# Patient Record
Sex: Female | Born: 1955 | Race: Black or African American | Hispanic: No | Smoking: Current every day smoker
Health system: Southern US, Community
[De-identification: ages and names within clinical notes are randomized; demographics above are authoritative.]

## PROBLEM LIST (undated history)

## (undated) DIAGNOSIS — F419 Anxiety disorder, unspecified: Secondary | ICD-10-CM

## (undated) DIAGNOSIS — K219 Gastro-esophageal reflux disease without esophagitis: Secondary | ICD-10-CM

## (undated) DIAGNOSIS — E119 Type 2 diabetes mellitus without complications: Secondary | ICD-10-CM

## (undated) DIAGNOSIS — M1990 Unspecified osteoarthritis, unspecified site: Secondary | ICD-10-CM

## (undated) DIAGNOSIS — E785 Hyperlipidemia, unspecified: Secondary | ICD-10-CM

## (undated) DIAGNOSIS — I10 Essential (primary) hypertension: Secondary | ICD-10-CM

## (undated) DIAGNOSIS — G709 Myoneural disorder, unspecified: Secondary | ICD-10-CM

## (undated) DIAGNOSIS — J302 Other seasonal allergic rhinitis: Secondary | ICD-10-CM

## (undated) HISTORY — DX: Essential (primary) hypertension: I10

## (undated) HISTORY — DX: Type 2 diabetes mellitus without complications (CMS-HCC): E11.9

## (undated) HISTORY — PX: CHOLECYSTECTOMY: SHX55

## (undated) MED ORDER — NA SULFATE-K SULFATE-MG SULF 17.5-3.13-1.6 GM/177ML PO SOLN
1.00 | Freq: Once | ORAL | 0 refills | Status: AC
Start: 2020-09-11 — End: 2020-09-11

---

## 2005-10-12 ENCOUNTER — Emergency Department: Payer: Self-pay | Admitting: Unknown Physician Specialty

## 2006-01-16 ENCOUNTER — Emergency Department: Payer: Self-pay | Admitting: Unknown Physician Specialty

## 2009-12-13 ENCOUNTER — Ambulatory Visit: Payer: Self-pay | Admitting: Internal Medicine

## 2011-01-16 ENCOUNTER — Emergency Department: Payer: Self-pay | Admitting: Emergency Medicine

## 2013-01-05 ENCOUNTER — Emergency Department: Payer: Self-pay | Admitting: Emergency Medicine

## 2013-01-05 LAB — CBC WITH DIFFERENTIAL/PLATELET
Basophil #: 0.1 10*3/uL (ref 0.0–0.1)
Basophil %: 1.1 %
Eosinophil #: 0.2 10*3/uL (ref 0.0–0.7)
Eosinophil %: 2.5 %
HCT: 42.3 % (ref 35.0–47.0)
HGB: 14.9 g/dL (ref 12.0–16.0)
Lymphocyte #: 3.4 10*3/uL (ref 1.0–3.6)
Lymphocyte %: 37.8 %
MCHC: 35.2 g/dL (ref 32.0–36.0)
Monocyte #: 0.7 x10 3/mm (ref 0.2–0.9)
Monocyte %: 7.4 %
Neutrophil #: 4.6 10*3/uL (ref 1.4–6.5)
Neutrophil %: 51.2 %
RBC: 4.18 10*6/uL (ref 3.80–5.20)
RDW: 12.5 % (ref 11.5–14.5)
WBC: 9 10*3/uL (ref 3.6–11.0)

## 2013-01-05 LAB — TROPONIN I: Troponin-I: <0.02 ng/mL

## 2013-01-05 LAB — COMPREHENSIVE METABOLIC PANEL
Albumin: 4 g/dL (ref 3.4–5.0)
Anion Gap: 5 — ABNORMAL LOW (ref 7–16)
BUN: 21 mg/dL — ABNORMAL HIGH (ref 7–18)
Co2: 30 mmol/L (ref 21–32)
Creatinine: 0.74 mg/dL (ref 0.60–1.30)
EGFR (Non-African Amer.): >60
Glucose: 134 mg/dL — ABNORMAL HIGH (ref 65–99)
SGOT(AST): 16 U/L (ref 15–37)
Sodium: 136 mmol/L (ref 136–145)

## 2013-01-05 LAB — CK TOTAL AND CKMB (NOT AT ARMC)
CK, Total: 65 U/L (ref 21–215)
CK-MB: 1 ng/mL (ref 0.5–3.6)

## 2013-10-30 ENCOUNTER — Ambulatory Visit: Payer: Self-pay | Admitting: Family Medicine

## 2015-01-25 ENCOUNTER — Emergency Department: Payer: Self-pay

## 2015-01-25 ENCOUNTER — Encounter: Payer: Self-pay | Admitting: Emergency Medicine

## 2015-01-25 DIAGNOSIS — E119 Type 2 diabetes mellitus without complications: Secondary | ICD-10-CM | POA: Insufficient documentation

## 2015-01-25 DIAGNOSIS — M25561 Pain in right knee: Secondary | ICD-10-CM | POA: Insufficient documentation

## 2015-01-25 DIAGNOSIS — Z720 Tobacco use: Secondary | ICD-10-CM | POA: Insufficient documentation

## 2015-01-25 DIAGNOSIS — M1990 Unspecified osteoarthritis, unspecified site: Secondary | ICD-10-CM | POA: Insufficient documentation

## 2015-01-25 DIAGNOSIS — M7981 Nontraumatic hematoma of soft tissue: Secondary | ICD-10-CM | POA: Insufficient documentation

## 2015-01-25 DIAGNOSIS — I10 Essential (primary) hypertension: Secondary | ICD-10-CM | POA: Insufficient documentation

## 2015-01-25 NOTE — ED Notes (Signed)
Patient states that 2 days ago she ran into her coffee table and she had a "knot" to her left leg. Patient states that today she noticed swelling and pain to right knee.

## 2015-01-26 ENCOUNTER — Emergency Department
Admission: EM | Admit: 2015-01-26 | Discharge: 2015-01-26 | Disposition: A | Discharge status: Home / Self Care | Payer: Self-pay | Attending: Student | Admitting: Student

## 2015-01-26 DIAGNOSIS — M25561 Pain in right knee: Secondary | ICD-10-CM

## 2015-01-26 DIAGNOSIS — M1990 Unspecified osteoarthritis, unspecified site: Secondary | ICD-10-CM

## 2015-01-26 MED ORDER — ACETAMINOPHEN 500 MG PO TABS
1000.0000 mg | ORAL_TABLET | Freq: Once | ORAL | Status: AC
  Administered 2015-01-26: 1000 mg via ORAL
  Filled 2015-01-26: qty 2

## 2015-01-26 NOTE — ED Provider Notes (Signed)
[facility] Emergency Department Provider Note  ____________________________________________  Time seen: Approximately 2:24 AM  I have reviewed the triage vital signs and the nursing notes.   HISTORY  Chief Complaint Knee Pain    HPI Christine Buchanan is a 59 y.o. female with history of hypertension and diabetes presents for evaluation of right knee pain, gradual onset, constant since onset. Patient reports that 3 or 4 days ago she bumped her right shin on her coffee table after which she developed a small bruise. Shortly afterward, she developed some pain and perceived swelling in the right knee which is worse with movement. Current severity of symptoms is mild. She denies any calf swelling or tenderness. No chest pain or difficulty breathing. She is otherwise been in her usual state of health.   No past medical history on file.  There are no active problems to display for this patient.   No past surgical history on file.  No current outpatient prescriptions on file.  Allergies Sulfa antibiotics  No family history on file.  Social History Social History  Substance Use Topics  . Smoking status: Current Every Day Smoker -- 0.50 packs/day for 40 years    Types: Cigarettes  . Smokeless tobacco: None  . Alcohol Use: Yes     Comment: occasionly    Review of Systems Constitutional: No fever/chills Eyes: No visual changes. ENT: No sore throat. Cardiovascular: Denies chest pain. Respiratory: Denies shortness of breath. Gastrointestinal: No abdominal pain.  No nausea, no vomiting.  No diarrhea.  No constipation. Genitourinary: Negative for dysuria. Musculoskeletal: Negative for back pain. Skin: Negative for rash. Neurological: Negative for headaches, focal weakness or numbness.  10-point ROS otherwise negative.  ____________________________________________   PHYSICAL EXAM:  VITAL SIGNS: ED Triage Vitals  Enc Vitals Group     BP 01/25/15  2127 142/86 mmHg     Pulse Rate 01/25/15 2127 91     Resp 01/25/15 2127 18     Temp 01/25/15 2127 97.9 F (36.6 C)     Temp Source 01/25/15 2127 Oral     SpO2 01/25/15 2127 98 %     Weight 01/25/15 2127 170 lb (77.111 kg)     Height 01/25/15 2127  (1.626 m)     Head Cir --      Peak Flow --      Pain Score 01/25/15 2131 7     Pain Loc --      Pain Edu? --      Excl. in GC? --     Constitutional: Alert and oriented. Well appearing and in no acute distress. Eyes: Conjunctivae are normal. PERRL. EOMI. Head: Atraumatic. Nose: No congestion/rhinnorhea. Mouth/Throat: Mucous membranes are moist.  Oropharynx non-erythematous. Neck: No stridor.  Cardiovascular: Normal rate, regular rhythm. Grossly normal heart sounds.  Good peripheral circulation. Respiratory: Normal respiratory effort.  No retractions. Lungs CTAB. Gastrointestinal: Soft and nontender. No distention. No abdominal bruits. No CVA tenderness. Genitourinary: deferred Musculoskeletal: Small ecchymosis of the right shin inferior to the right knee, there is no swelling associated with the right lower leg or the right knee. The patient has range of motion actively at the right knee, 2+ right DP pulse, wiggles the toes. There is no calf tenderness or asymmetry. Neurologic:  Normal speech and language. No gross focal neurologic deficits are appreciated. No gait instability. Skin:  Skin is warm, dry and intact. No rash noted. Psychiatric: Mood and affect are normal. Speech and behavior are normal.  ____________________________________________  LABS (all labs ordered are listed, but only abnormal results are displayed)  Labs Reviewed - No data to display ____________________________________________  EKG  none ____________________________________________  RADIOLOGY  Xray right knee  FINDINGS: Four views of the right knee submitted. No acute fracture or subluxation. There is narrowing of medial joint compartment.  Mild spurring of medial femoral condyle and medial tibial plateau. Minimal spurring of patella. No joint effusion.  IMPRESSION: No acute fracture or subluxation. Osteoarthritic changes as described above.  ____________________________________________   PROCEDURES  Procedure(s) performed: None  Critical Care performed: No  ____________________________________________   INITIAL IMPRESSION / ASSESSMENT AND PLAN / ED COURSE  Pertinent labs & imaging results that were available during my care of the patient were reviewed by me and considered in my medical decision making (see chart for details).  Christine Buchanan is a 59 y.o. female with history of hypertension and diabetes presents for evaluation of right knee pain, gradual onset, constant since onset. On exam, she is very well-appearing and in no acute distress. She had lites without any difficulty. She has full range of motion at the right knee, neurovascularly is intact in the right lower extremity, no calf swelling or tenderness. No warmth, erythema. Not consistent with cellulitis or septic joint. Not consistent with acute DVT. Plain films show osteoarthritis which is the most likely cause of her symptoms. Discussed rest, treatment with over-the-counter Tylenol or Motrin for pain, return precautions and she is comfortable with the discharge plan. ____________________________________________   FINAL CLINICAL IMPRESSION(S) / ED DIAGNOSES  Final diagnoses:  Knee pain, acute, right  Arthritis      Gayla Doss, MD 01/26/15 [phone]

## 2015-03-15 ENCOUNTER — Encounter: Payer: Self-pay | Admitting: *Deleted

## 2015-03-15 ENCOUNTER — Emergency Department: Payer: Self-pay

## 2015-03-15 ENCOUNTER — Emergency Department
Admission: EM | Admit: 2015-03-15 | Discharge: 2015-03-15 | Disposition: A | Discharge status: Home / Self Care | Payer: Self-pay | Attending: Emergency Medicine | Admitting: Emergency Medicine

## 2015-03-15 DIAGNOSIS — I10 Essential (primary) hypertension: Secondary | ICD-10-CM | POA: Insufficient documentation

## 2015-03-15 DIAGNOSIS — Z720 Tobacco use: Secondary | ICD-10-CM | POA: Insufficient documentation

## 2015-03-15 DIAGNOSIS — E119 Type 2 diabetes mellitus without complications: Secondary | ICD-10-CM | POA: Insufficient documentation

## 2015-03-15 DIAGNOSIS — G4700 Insomnia, unspecified: Secondary | ICD-10-CM | POA: Insufficient documentation

## 2015-03-15 DIAGNOSIS — M19042 Primary osteoarthritis, left hand: Secondary | ICD-10-CM | POA: Insufficient documentation

## 2015-03-15 DIAGNOSIS — M19041 Primary osteoarthritis, right hand: Secondary | ICD-10-CM | POA: Insufficient documentation

## 2015-03-15 HISTORY — DX: Hyperlipidemia, unspecified: E78.5

## 2015-03-15 HISTORY — DX: Essential (primary) hypertension: I10

## 2015-03-15 HISTORY — DX: Type 2 diabetes mellitus without complications: E11.9

## 2015-03-15 HISTORY — DX: Anxiety disorder, unspecified: F41.9

## 2015-03-15 MED ORDER — HYDROCODONE-ACETAMINOPHEN 5-325 MG PO TABS: 1.0000 | ORAL_TABLET | ORAL | Status: DC | PRN

## 2015-03-15 MED ORDER — CLONAZEPAM 0.5 MG PO TABS: 0.5000 mg | ORAL_TABLET | Freq: Two times a day (BID) | ORAL | Status: DC | PRN

## 2015-03-15 MED ORDER — KETOROLAC TROMETHAMINE 60 MG/2ML IM SOLN
60.0000 mg | Freq: Once | INTRAMUSCULAR | Status: AC
  Administered 2015-03-15: 60 mg via INTRAMUSCULAR
  Filled 2015-03-15: qty 2

## 2015-03-15 MED ORDER — DICLOFENAC SODIUM 75 MG PO TBEC: 75.0000 mg | DELAYED_RELEASE_TABLET | Freq: Two times a day (BID) | ORAL | Status: DC

## 2015-03-15 NOTE — ED Notes (Signed)
Pt reports pain to bilateral hands since yesterday. Pt reports she believes it is arthritis. Pain into left wrist as well. No known injury.

## 2015-03-15 NOTE — Discharge Instructions (Signed)
Heat Therapy Heat therapy can help ease sore, stiff, injured, and tight muscles and joints. Heat relaxes your muscles, which may help ease your pain.  RISKS AND COMPLICATIONS If you have any of the following conditions, do not use heat therapy unless your health care provider has approved:  Poor circulation.  Healing wounds or scarred skin in the area being treated.  Diabetes, heart disease, or high blood pressure.  Not being able to feel (numbness) the area being treated.  Unusual swelling of the area being treated.  Active infections.  Blood clots.  Cancer.  Inability to communicate pain. This may include young children and people who have problems with their brain function (dementia).  Pregnancy. Heat therapy should only be used on old, pre-existing, or long-lasting (chronic) injuries. Do not use heat therapy on new injuries unless directed by your health care provider. HOW TO USE HEAT THERAPY There are several different kinds of heat therapy, including:  Moist heat pack.  Warm water bath.  Hot water bottle.  Electric heating pad.  Heated gel pack.  Heated wrap.  Electric heating pad. Use the heat therapy method suggested by your health care provider. Follow your health care provider's instructions on when and how to use heat therapy. GENERAL HEAT THERAPY RECOMMENDATIONS  Do not sleep while using heat therapy. Only use heat therapy while you are awake.  Your skin may turn pink while using heat therapy. Do not use heat therapy if your skin turns red.  Do not use heat therapy if you have new pain.  High heat or long exposure to heat can cause burns. Be careful when using heat therapy to avoid burning your skin.  Do not use heat therapy on areas of your skin that are already irritated, such as with a rash or sunburn. SEEK MEDICAL CARE IF:  You have blisters, redness, swelling, or numbness.  You have new pain.  Your pain is worse. MAKE SURE  YOU:  Understand these instructions.  Will watch your condition.  Will get help right away if you are not doing well or get worse. Document Released: 08/24/2011 Document Revised: 10/16/2013 Document Reviewed: 07/25/2013 [facility] Patient Information 2015 [place], [place]. This information is not intended to replace advice given to you by your health care provider. Make sure you discuss any questions you have with your health care provider.  Osteoarthritis Osteoarthritis is a disease that causes soreness and inflammation of a joint. It occurs when the cartilage at the affected joint wears down. Cartilage acts as a cushion, covering the ends of bones where they meet to form a joint. Osteoarthritis is the most common form of arthritis. It often occurs in older people. The joints affected most often by this condition include those in the:  Ends of the fingers.  Thumbs.  Neck.  Lower back.  Knees.  Hips. CAUSES  Over time, the cartilage that covers the ends of bones begins to wear away. This causes bone to rub on bone, producing pain and stiffness in the affected joints.  RISK FACTORS Certain factors can increase your chances of having osteoarthritis, including:  Older age.  Excessive body weight.  Overuse of joints.  Previous joint injury. SIGNS AND SYMPTOMS   Pain, swelling, and stiffness in the joint.  Over time, the joint may lose its normal shape.  Small deposits of bone (osteophytes) may grow on the edges of the joint.  Bits of bone or cartilage can break off and float inside the joint space. This may cause  more pain and damage. DIAGNOSIS  Your health care provider will do a physical exam and ask about your symptoms. Various tests may be ordered, such as:  X-rays of the affected joint.  An MRI scan.  Blood tests to rule out other types of arthritis.  Joint fluid tests. This involves using a needle to draw fluid from the joint and examining the fluid under a  microscope. TREATMENT  Goals of treatment are to control pain and improve joint function. Treatment plans may include:  A prescribed exercise program that allows for rest and joint relief.  A weight control plan.  Pain relief techniques, such as:  Properly applied heat and cold.  Electric pulses delivered to nerve endings under the skin (transcutaneous electrical nerve stimulation [TENS]).  Massage.  Certain nutritional supplements.  Medicines to control pain, such as:  Acetaminophen.  Nonsteroidal anti-inflammatory drugs (NSAIDs), such as naproxen.  Narcotic or central-acting agents, such as tramadol.  Corticosteroids. These can be given orally or as an injection.  Surgery to reposition the bones and relieve pain (osteotomy) or to remove loose pieces of bone and cartilage. Joint replacement may be needed in advanced states of osteoarthritis. HOME CARE INSTRUCTIONS   Take medicines only as directed by your health care provider.  Maintain a healthy weight. Follow your health care provider's instructions for weight control. This may include dietary instructions.  Exercise as directed. Your health care provider can recommend specific types of exercise. These may include:  Strengthening exercises. These are done to strengthen the muscles that support joints affected by arthritis. They can be performed with weights or with exercise bands to add resistance.  Aerobic activities. These are exercises, such as brisk walking or low-impact aerobics, that get your heart pumping.  Range-of-motion activities. These keep your joints limber.  Balance and agility exercises. These help you maintain daily living skills.  Rest your affected joints as directed by your health care provider.  Keep all follow-up visits as directed by your health care provider. SEEK MEDICAL CARE IF:   Your skin turns red.  You develop a rash in addition to your joint pain.  You have worsening joint  pain.  You have a fever along with joint or muscle aches. SEEK IMMEDIATE MEDICAL CARE IF:  You have a significant loss of weight or appetite.  You have night sweats. FOR MORE INFORMATION   National Institute of Arthritis and Musculoskeletal and Skin Diseases: www.niams.http://www.myers.net/  [employer] on Aging: https://walker.com/  American College of Rheumatology: www.rheumatology.org Document Released: 06/01/2005 Document Revised: 10/16/2013 Document Reviewed: 02/06/2013 [facility] Patient Information 2015 [place], [place]. This information is not intended to replace advice given to you by your health care provider. Make sure you discuss any questions you have with your health care provider.

## 2015-03-15 NOTE — ED Provider Notes (Signed)
[facility] Emergency Department Leonardo Plaia Note  ____________________________________________  Time seen: Approximately 12:59 PM  I have reviewed the triage vital signs and the nursing notes.   HISTORY  Chief Complaint Hand Pain    HPI Christine Buchanan is a 59 y.o. female since for evaluation of bilateral hand pain. Patient states that she thinks is arthritis but the pain continues. Unable to sleep last night secondary to the pain. Eyes any trauma   Past Medical History  Diagnosis Date  . Hypertension   . Diabetes mellitus without complication   . Hyperlipidemia   . Anxiety     There are no active problems to display for this patient.   Past Surgical History  Procedure Laterality Date  . Cholecystectomy    . Cesarean section      Current Outpatient Rx  Name  Route  Sig  Dispense  Refill  . clonazePAM (KLONOPIN) 0.5 MG tablet   Oral   Take 1 tablet (0.5 mg total) by mouth 2 (two) times daily as needed for anxiety.   10 tablet   0   . diclofenac (VOLTAREN) 75 MG EC tablet   Oral   Take 1 tablet (75 mg total) by mouth 2 (two) times daily.   60 tablet   0   . HYDROcodone-acetaminophen (NORCO) 5-325 MG tablet   Oral   Take 1-2 tablets by mouth every 4 (four) hours as needed for moderate pain.   10 tablet   0     Allergies Sulfa antibiotics  No family history on file.  Social History Social History  Substance Use Topics  . Smoking status: Current Every Day Smoker -- 0.50 packs/day for 40 years    Types: Cigarettes  . Smokeless tobacco: None  . Alcohol Use: Yes     Comment: occasionly    Review of Systems Constitutional: No fever/chills Eyes: No visual changes. ENT: No sore throat. Cardiovascular: Denies chest pain. Respiratory: Denies shortness of breath. Gastrointestinal: No abdominal pain.  No nausea, no vomiting.  No diarrhea.  No constipation. Genitourinary: Negative for dysuria. Musculoskeletal: Positive for  bilateral hand pain. Skin: Negative for rash. Neurological: Negative for headaches, focal weakness or numbness.  10-point ROS otherwise negative.  ____________________________________________   PHYSICAL EXAM:  VITAL SIGNS: ED Triage Vitals  Enc Vitals Group     BP 03/15/15 1231 157/83 mmHg     Pulse Rate 03/15/15 1231 95     Resp 03/15/15 1231 16     Temp 03/15/15 1231 98 F (36.7 C)     Temp Source 03/15/15 1231 Oral     SpO2 03/15/15 1231 98 %     Weight 03/15/15 1231 170 lb (77.111 kg)     Height 03/15/15 1231  (1.626 m)     Head Cir --      Peak Flow --      Pain Score --      Pain Loc --      Pain Edu? --      Excl. in GC? --     Constitutional: Alert and oriented. Well appearing and in no acute distress. Cardiovascular: Normal rate, regular rhythm. Grossly normal heart sounds.  Good peripheral circulation. Respiratory: Normal respiratory effort.  No retractions. Lungs CTAB. Musculoskeletal: Bilateral hand pain left greater than right. Positive arthritic deformities noted. Neurologic:  Normal speech and language. No gross focal neurologic deficits are appreciated. No gait instability. Skin:  Skin is warm, dry and intact. No rash noted. Psychiatric:  Mood and affect are normal. Speech and behavior are normal.  ____________________________________________   LABS (all labs ordered are listed, but only abnormal results are displayed)  Labs Reviewed - No data to display  RADIOLOGY  Osteoarthritis of both hands interpreted by radiologist reviewed by myself. ____________________________________________   PROCEDURES  Procedure(s) performed: None  Critical Care performed: No  ____________________________________________   INITIAL IMPRESSION / ASSESSMENT AND PLAN / ED COURSE  Pertinent labs & imaging results that were available during my care of the patient were reviewed by me and considered in my medical decision making (see chart for  details).  Osteoarthritis both hands. Rx given for Voltaren 75 mg twice a day Toradol 60 mg IM given while in the ED. As a courtesy refill clonazepam 0.5 mg #15 for anxiety/insomnia. Patient follow-up with her PCP as needed. At the time of discharge patient voices no other emergency medical conditions at this time. ____________________________________________   FINAL CLINICAL IMPRESSION(S) / ED DIAGNOSES  Final diagnoses:  Osteoarthritis of both hands, unspecified osteoarthritis type  Insomnia, controlled      Evangeline Dakin, PA-C 03/15/15 1405  Myrna Blazer, MD 03/15/15 2154

## 2017-04-02 ENCOUNTER — Other Ambulatory Visit: Payer: Self-pay | Admitting: Student

## 2017-04-02 DIAGNOSIS — M67431 Ganglion, right wrist: Secondary | ICD-10-CM

## 2017-04-20 ENCOUNTER — Ambulatory Visit: Admission: RE | Admit: 2017-04-20 | Payer: Medicaid Other | Source: Ambulatory Visit

## 2017-06-05 ENCOUNTER — Ambulatory Visit: Payer: Medicaid Other

## 2017-06-17 ENCOUNTER — Encounter: Payer: Self-pay | Admitting: Emergency Medicine

## 2017-06-17 ENCOUNTER — Emergency Department
Admission: EM | Admit: 2017-06-17 | Discharge: 2017-06-17 | Disposition: A | Discharge status: Home / Self Care | Payer: Medicaid Other | Attending: Emergency Medicine | Admitting: Emergency Medicine

## 2017-06-17 DIAGNOSIS — E119 Type 2 diabetes mellitus without complications: Secondary | ICD-10-CM | POA: Diagnosis not present

## 2017-06-17 DIAGNOSIS — I10 Essential (primary) hypertension: Secondary | ICD-10-CM | POA: Insufficient documentation

## 2017-06-17 DIAGNOSIS — E785 Hyperlipidemia, unspecified: Secondary | ICD-10-CM | POA: Insufficient documentation

## 2017-06-17 DIAGNOSIS — M545 Low back pain: Secondary | ICD-10-CM | POA: Diagnosis present

## 2017-06-17 DIAGNOSIS — F17210 Nicotine dependence, cigarettes, uncomplicated: Secondary | ICD-10-CM | POA: Diagnosis not present

## 2017-06-17 DIAGNOSIS — Z79899 Other long term (current) drug therapy: Secondary | ICD-10-CM | POA: Diagnosis not present

## 2017-06-17 DIAGNOSIS — G8929 Other chronic pain: Secondary | ICD-10-CM

## 2017-06-17 DIAGNOSIS — M5441 Lumbago with sciatica, right side: Secondary | ICD-10-CM | POA: Diagnosis not present

## 2017-06-17 MED ORDER — PREDNISONE 20 MG PO TABS
60.0000 mg | ORAL_TABLET | Freq: Once | ORAL | Status: AC
  Administered 2017-06-17: 60 mg via ORAL
  Filled 2017-06-17: qty 3

## 2017-06-17 MED ORDER — PREDNISONE 50 MG PO TABS: ORAL_TABLET | ORAL | 0 refills | Status: DC

## 2017-06-17 NOTE — ED Notes (Signed)
Pt ambulatory upon discharge; insisted on pushing wheel chair out with her things on it while she waits for a ride. Verbalized understanding of discharge instructions, prescription and follow-up care. Pt reports following up 1/7 with orthopaedic doctor and 1/10 with her PCP. A&O x4. Skin warm and dry.

## 2017-06-17 NOTE — ED Triage Notes (Signed)
Pt comes into the ED via POV c/o lower back pain that started years ago.  Patient has chronic lower back pain and states it is not getting any better.  Patient sees her orthopedist on the 7th but states she has a hard time standing due to the pain. Patient ambulatory to triage at this time and in NAD with even and unlabored respirations.

## 2017-06-17 NOTE — ED Notes (Signed)
Pt reports lower back pain for "years and years." Denies any radiation to hips or legs. Denies numbness or tingling. Pt states she has been in 2 [facility] over the years and worked 13 years for [employer] lifting heavy boxes and bending over. Pain worsens while bending over. Pt denies recent injury. [facility]. Referred her for "shots in her back" but she never got them d/t "not wanting them" and only recently obtaining medicaid.

## 2017-06-17 NOTE — ED Provider Notes (Signed)
[facility] Emergency Department Provider Note  ____________________________________________  Time seen: Approximately 9:49 PM  I have reviewed the triage vital signs and the nursing notes.   HISTORY  Chief Complaint Back Pain    HPI RIELLY BRUNN is a 62 y.o. female presents to the emergency department with chronic low back pain with right lower extremity radiculopathy.  Patient reports that she has experienced low back pain for "years".  Patient reports that she has an appointment with a neurosurgeon in 4 days to discuss possible back injections.  She denies saddle anesthesia or episodes of incontinence.  No recent falls or mechanisms of trauma.  Patient denies dysuria, hematuria, increased urinary frequency or bilateral flank pain.  She has been afebrile and denies recreational drug use.   Past Medical History:  Diagnosis Date  . Anxiety   . Diabetes mellitus without complication (HCC)   . Hyperlipidemia   . Hypertension     There are no active problems to display for this patient.   Past Surgical History:  Procedure Laterality Date  . CESAREAN SECTION    . CHOLECYSTECTOMY      Prior to Admission medications   Medication Sig Start Date End Date Taking? Authorizing Provider  clonazePAM (KLONOPIN) 0.5 MG tablet Take 1 tablet (0.5 mg total) by mouth 2 (two) times daily as needed for anxiety. 03/15/15   Beers, Charmayne Sheer, PA-C  diclofenac (VOLTAREN) 75 MG EC tablet Take 1 tablet (75 mg total) by mouth 2 (two) times daily. 03/15/15   Beers, Charmayne Sheer, PA-C  HYDROcodone-acetaminophen (NORCO) 5-325 MG tablet Take 1-2 tablets by mouth every 4 (four) hours as needed for moderate pain. 03/15/15   Beers, Charmayne Sheer, PA-C  predniSONE (DELTASONE) 50 MG tablet Take one 50 mg tablet once a day for 5 days. 06/17/17   Orvil Feil, PA-C    Allergies Sulfa antibiotics  No family history on file.  Social History Social History   Tobacco Use  . Smoking  status: Current Every Day Smoker    Packs/day: 0.50    Years: 40.00    Pack years: 20.00    Types: Cigarettes  . Smokeless tobacco: Never Used  Substance Use Topics  . Alcohol use: Yes    Comment: occasionly  . Drug use: No     Review of Systems  Constitutional: No fever/chills Eyes: No visual changes. No discharge ENT: No upper respiratory complaints. Cardiovascular: no chest pain. Respiratory: no cough. No SOB. Gastrointestinal: No abdominal pain.  No nausea, no vomiting.  No diarrhea.  No constipation. Genitourinary: Negative for dysuria. No hematuria Musculoskeletal: Patient has low back pain with right lower extremity radiculopathy.  Skin: Negative for rash, abrasions, lacerations, ecchymosis. Neurological: Negative for headaches, focal weakness or numbness.   ____________________________________________   PHYSICAL EXAM:  VITAL SIGNS: ED Triage Vitals  Enc Vitals Group     BP 06/17/17 1813 121/75     Pulse Rate 06/17/17 1813 81     Resp 06/17/17 1813 18     Temp 06/17/17 1813 97.8 F (36.6 C)     Temp Source 06/17/17 1813 Oral     SpO2 06/17/17 1813 98 %     Weight 06/17/17 1811 175 lb (79.4 kg)     Height 06/17/17 1811 5\' 4"  (1.626 m)     Head Circumference --      Peak Flow --      Pain Score 06/17/17 1811 9     Pain Loc --  Pain Edu? --      Excl. in GC? --      Constitutional: Alert and oriented. Well appearing and in no acute distress. Eyes: Conjunctivae are normal. PERRL. EOMI. Head: Atraumatic.  Cardiovascular: Normal rate, regular rhythm. Normal S1 and S2.  Good peripheral circulation. Respiratory: Normal respiratory effort without tachypnea or retractions. Lungs CTAB. Good air entry to the bases with no decreased or absent breath sounds. Gastrointestinal: Bowel sounds 4 quadrants. Soft and nontender to palpation. No guarding or rigidity. No palpable masses. No distention. No CVA tenderness. Musculoskeletal: Full range of motion to all  extremities. No gross deformities appreciated.  No midline lumbar spinal tenderness.  Patient has positive straight leg raise test, right. Neurologic:  Normal speech and language. No gross focal neurologic deficits are appreciated.  Skin:  Skin is warm, dry and intact. No rash noted. Psychiatric: Mood and affect are normal. Speech and behavior are normal. Patient exhibits appropriate insight and judgement.   ____________________________________________   LABS (all labs ordered are listed, but only abnormal results are displayed)  Labs Reviewed - No data to display ____________________________________________  EKG   ____________________________________________  RADIOLOGY   No results found.  ____________________________________________    PROCEDURES  Procedure(s) performed:    Procedures    Medications  predniSONE (DELTASONE) tablet 60 mg (60 mg Oral Given 06/17/17 1904)     ____________________________________________   INITIAL IMPRESSION / ASSESSMENT AND PLAN / ED COURSE  Pertinent labs & imaging results that were available during my care of the patient were reviewed by me and considered in my medical decision making (see chart for details).  Review of the [place] CSRS was performed in accordance of the [facility] prior to dispensing any controlled drugs.     Assessment and plan Low back pain with right lower extremity radiculopathy Patient presented to the emergency department with low back pain with right lower extremity radiculopathy.  Differential diagnosis includes sciatica versus herniated disc versus lumbar strain.  Physical exam findings were consistent with sciatica.  Patient was started empirically on prednisone and advised to keep neurosurgery appointment in 4 days.  Patient voiced understanding regarding this recommendation.  Vital signs were reassuring prior to discharge.  All patient questions were  answered.     ____________________________________________  FINAL CLINICAL IMPRESSION(S) / ED DIAGNOSES  Final diagnoses:  Chronic right-sided low back pain with right-sided sciatica      NEW MEDICATIONS STARTED DURING THIS VISIT:  ED Discharge Orders        Ordered    predniSONE (DELTASONE) 50 MG tablet     06/17/17 1845          This chart was dictated using voice recognition software/Dragon. Despite best efforts to proofread, errors can occur which can change the meaning. Any change was purely unintentional.    Gasper LloydWoods, Abiageal Blowe M, PA-C 06/17/17 2156    Sharman CheekStafford, Phillip, MD 06/17/17 2337

## 2017-06-18 ENCOUNTER — Observation Stay: Payer: Medicaid Other

## 2017-06-18 ENCOUNTER — Emergency Department: Payer: Medicaid Other

## 2017-06-18 ENCOUNTER — Telehealth: Payer: Self-pay | Admitting: Emergency Medicine

## 2017-06-18 ENCOUNTER — Observation Stay
Admission: EM | Admit: 2017-06-18 | Discharge: 2017-06-20 | Disposition: A | Discharge status: Home / Self Care | Payer: Medicaid Other | Attending: Internal Medicine | Admitting: Internal Medicine

## 2017-06-18 ENCOUNTER — Other Ambulatory Visit: Payer: Self-pay

## 2017-06-18 ENCOUNTER — Encounter: Payer: Self-pay | Admitting: Emergency Medicine

## 2017-06-18 DIAGNOSIS — F1021 Alcohol dependence, in remission: Secondary | ICD-10-CM

## 2017-06-18 DIAGNOSIS — F339 Major depressive disorder, recurrent, unspecified: Secondary | ICD-10-CM

## 2017-06-18 DIAGNOSIS — I10 Essential (primary) hypertension: Secondary | ICD-10-CM | POA: Diagnosis not present

## 2017-06-18 DIAGNOSIS — F1211 Cannabis abuse, in remission: Secondary | ICD-10-CM | POA: Diagnosis not present

## 2017-06-18 DIAGNOSIS — Z794 Long term (current) use of insulin: Secondary | ICD-10-CM | POA: Diagnosis not present

## 2017-06-18 DIAGNOSIS — G9340 Encephalopathy, unspecified: Secondary | ICD-10-CM | POA: Diagnosis not present

## 2017-06-18 DIAGNOSIS — Z79899 Other long term (current) drug therapy: Secondary | ICD-10-CM | POA: Insufficient documentation

## 2017-06-18 DIAGNOSIS — F1411 Cocaine abuse, in remission: Secondary | ICD-10-CM

## 2017-06-18 DIAGNOSIS — E785 Hyperlipidemia, unspecified: Secondary | ICD-10-CM | POA: Insufficient documentation

## 2017-06-18 DIAGNOSIS — F419 Anxiety disorder, unspecified: Secondary | ICD-10-CM | POA: Diagnosis not present

## 2017-06-18 DIAGNOSIS — F17210 Nicotine dependence, cigarettes, uncomplicated: Secondary | ICD-10-CM | POA: Insufficient documentation

## 2017-06-18 DIAGNOSIS — G2581 Restless legs syndrome: Secondary | ICD-10-CM | POA: Insufficient documentation

## 2017-06-18 DIAGNOSIS — F329 Major depressive disorder, single episode, unspecified: Secondary | ICD-10-CM | POA: Insufficient documentation

## 2017-06-18 DIAGNOSIS — E119 Type 2 diabetes mellitus without complications: Secondary | ICD-10-CM | POA: Diagnosis not present

## 2017-06-18 DIAGNOSIS — Z5309 Procedure and treatment not carried out because of other contraindication: Secondary | ICD-10-CM

## 2017-06-18 DIAGNOSIS — R4182 Altered mental status, unspecified: Secondary | ICD-10-CM | POA: Diagnosis present

## 2017-06-18 DIAGNOSIS — E860 Dehydration: Secondary | ICD-10-CM | POA: Diagnosis not present

## 2017-06-18 DIAGNOSIS — F410 Panic disorder [episodic paroxysmal anxiety] without agoraphobia: Secondary | ICD-10-CM

## 2017-06-18 LAB — SALICYLATE LEVEL: Salicylate Lvl: <7 mg/dL (ref 2.8–30.0)

## 2017-06-18 LAB — GLUCOSE, CAPILLARY
GLUCOSE-CAPILLARY: 174 mg/dL — AB (ref 65–99)
GLUCOSE-CAPILLARY: 86 mg/dL (ref 65–99)
Glucose-Capillary: 334 mg/dL — ABNORMAL HIGH (ref 65–99)

## 2017-06-18 LAB — ACETAMINOPHEN LEVEL: Acetaminophen (Tylenol), Serum: <10 ug/mL — ABNORMAL LOW (ref 10–30)

## 2017-06-18 LAB — URINE DRUG SCREEN, QUALITATIVE (ARMC ONLY)
Amphetamines, Ur Screen: NOT DETECTED
BARBITURATES, UR SCREEN: NOT DETECTED
Benzodiazepine, Ur Scrn: NOT DETECTED
CANNABINOID 50 NG, UR ~~LOC~~: NOT DETECTED
Cocaine Metabolite,Ur ~~LOC~~: NOT DETECTED
MDMA (Ecstasy)Ur Screen: NOT DETECTED
Methadone Scn, Ur: NOT DETECTED
Opiate, Ur Screen: NOT DETECTED
Phencyclidine (PCP) Ur S: NOT DETECTED
TRICYCLIC, UR SCREEN: POSITIVE — AB

## 2017-06-18 LAB — COMPREHENSIVE METABOLIC PANEL
ALT: 15 U/L (ref 14–54)
AST: 26 U/L (ref 15–41)
Albumin: 4.1 g/dL (ref 3.5–5.0)
Alkaline Phosphatase: 88 U/L (ref 38–126)
Anion gap: 11 (ref 5–15)
BUN: 24 mg/dL — ABNORMAL HIGH (ref 6–20)
CO2: 28 mmol/L (ref 22–32)
Calcium: 9.7 mg/dL (ref 8.9–10.3)
Chloride: 97 mmol/L — ABNORMAL LOW (ref 101–111)
Creatinine, Ser: 1.31 mg/dL — ABNORMAL HIGH (ref 0.44–1.00)
GFR calc Af Amer: 50 mL/min — ABNORMAL LOW (ref >60)
GFR calc non Af Amer: 43 mL/min — ABNORMAL LOW (ref >60)
Glucose, Bld: 366 mg/dL — ABNORMAL HIGH (ref 65–99)
Potassium: 3.9 mmol/L (ref 3.5–5.1)
Sodium: 136 mmol/L (ref 135–145)
Total Bilirubin: 0.5 mg/dL (ref 0.3–1.2)
Total Protein: 8.4 g/dL — ABNORMAL HIGH (ref 6.5–8.1)

## 2017-06-18 LAB — URINALYSIS, COMPLETE (UACMP) WITH MICROSCOPIC
Bacteria, UA: NONE SEEN
Bilirubin Urine: NEGATIVE
Glucose, UA: >=500 mg/dL — AB
Ketones, ur: NEGATIVE mg/dL
Nitrite: NEGATIVE
Protein, ur: NEGATIVE mg/dL
Specific Gravity, Urine: 1.016 (ref 1.005–1.030)
pH: 6 (ref 5.0–8.0)

## 2017-06-18 LAB — CBC
HCT: 38.5 % (ref 35.0–47.0)
Hemoglobin: 13.1 g/dL (ref 12.0–16.0)
MCH: 33.8 pg (ref 26.0–34.0)
MCHC: 33.9 g/dL (ref 32.0–36.0)
MCV: 99.7 fL (ref 80.0–100.0)
Platelets: 266 10*3/uL (ref 150–440)
RBC: 3.86 MIL/uL (ref 3.80–5.20)
RDW: 12.9 % (ref 11.5–14.5)
WBC: 10.4 10*3/uL (ref 3.6–11.0)

## 2017-06-18 LAB — AMMONIA: AMMONIA: 20 umol/L (ref 9–35)

## 2017-06-18 LAB — ETHANOL: Alcohol, Ethyl (B): <10 mg/dL (ref <10)

## 2017-06-18 MED ORDER — SODIUM CHLORIDE 0.9 % IV BOLUS (SEPSIS)
1000.0000 mL | Freq: Once | INTRAVENOUS | Status: AC
  Administered 2017-06-18: 1000 mL via INTRAVENOUS

## 2017-06-18 MED ORDER — INSULIN ASPART 100 UNIT/ML ~~LOC~~ SOLN
0.0000 [IU] | Freq: Three times a day (TID) | SUBCUTANEOUS | Status: DC
  Administered 2017-06-19 (×3): 2 [IU] via SUBCUTANEOUS
  Administered 2017-06-20: 1 [IU] via SUBCUTANEOUS
  Administered 2017-06-20: 2 [IU] via SUBCUTANEOUS
  Filled 2017-06-18 (×5): qty 1

## 2017-06-18 MED ORDER — SODIUM CHLORIDE 0.9 % IV SOLN
INTRAVENOUS | Status: DC
  Administered 2017-06-18 – 2017-06-20 (×4): via INTRAVENOUS

## 2017-06-18 MED ORDER — ROPINIROLE HCL 1 MG PO TABS
1.0000 mg | ORAL_TABLET | Freq: Two times a day (BID) | ORAL | Status: DC
  Administered 2017-06-18 – 2017-06-20 (×4): 1 mg via ORAL
  Filled 2017-06-18 (×4): qty 1

## 2017-06-18 MED ORDER — ONDANSETRON HCL 4 MG/2ML IJ SOLN: 4.0000 mg | Freq: Four times a day (QID) | INTRAMUSCULAR | Status: DC | PRN

## 2017-06-18 MED ORDER — INSULIN ASPART 100 UNIT/ML ~~LOC~~ SOLN: 0.0000 [IU] | Freq: Every day | SUBCUTANEOUS | Status: DC

## 2017-06-18 MED ORDER — ACETAMINOPHEN 650 MG RE SUPP: 650.0000 mg | Freq: Four times a day (QID) | RECTAL | Status: DC | PRN

## 2017-06-18 MED ORDER — HEPARIN SODIUM (PORCINE) 5000 UNIT/ML IJ SOLN
5000.0000 [IU] | Freq: Three times a day (TID) | INTRAMUSCULAR | Status: DC
  Administered 2017-06-18 – 2017-06-20 (×5): 5000 [IU] via SUBCUTANEOUS
  Filled 2017-06-18 (×5): qty 1

## 2017-06-18 MED ORDER — PAROXETINE HCL 20 MG PO TABS
60.0000 mg | ORAL_TABLET | Freq: Every day | ORAL | Status: DC
  Administered 2017-06-18 – 2017-06-19 (×2): 60 mg via ORAL
  Filled 2017-06-18 (×2): qty 3

## 2017-06-18 MED ORDER — NALOXONE HCL 2 MG/2ML IJ SOSY
PREFILLED_SYRINGE | INTRAMUSCULAR | Status: AC
  Administered 2017-06-18: 2 mg
  Filled 2017-06-18: qty 2

## 2017-06-18 MED ORDER — ONDANSETRON HCL 4 MG PO TABS: 4.0000 mg | ORAL_TABLET | Freq: Four times a day (QID) | ORAL | Status: DC | PRN

## 2017-06-18 MED ORDER — INSULIN ASPART 100 UNIT/ML ~~LOC~~ SOLN
6.0000 [IU] | Freq: Once | SUBCUTANEOUS | Status: AC
  Administered 2017-06-18: 6 [IU] via INTRAVENOUS
  Filled 2017-06-18: qty 1

## 2017-06-18 MED ORDER — ACETAMINOPHEN 325 MG PO TABS
650.0000 mg | ORAL_TABLET | Freq: Four times a day (QID) | ORAL | Status: DC | PRN
  Administered 2017-06-18: 650 mg via ORAL
  Filled 2017-06-18: qty 2

## 2017-06-18 NOTE — ED Notes (Signed)
Banana was placed instead of foley. Pt has urinated clear yellow urine in the amount of 450 ml. Admitting Provider is aware as e is at bedside.

## 2017-06-18 NOTE — H&P (Signed)
[facility] [place] [facility]   PATIENT NAME: Christine Buchanan    MR#:  [id number]  DATE OF BIRTH:  01/25/1956  DATE OF ADMISSION:  06/18/2017  PRIMARY CARE PHYSICIAN: Hillery AldoPatel, Sarah, MD   REQUESTING/REFERRING PHYSICIAN: Dr. Ileana RoupJames McShane  CHIEF COMPLAINT:   Chief Complaint  Patient presents with  . Altered Mental Status  . Hyperglycemia    HISTORY OF PRESENT ILLNESS:  Christine SodaKathryn Buchanan  is a 62 y.o. female with a known history of diabetes, hypertension, hyperlipidemia, anxiety who presents to the hospital due to altered mental status/encephalopathy. Patient was in the emergency room yesterday complaining of back pain and diagnosed with sciatica but the ER physician and discharged on a prednisone taper. She was given a bus pass and discharged earlier this morning and was found down near the bus stop a few hours later and sent back to the ER for further evaluation. Patient has had an extensive workup for altered mental status including CT head, urinalysis, ammonia level, electrolytes, ABG all of which have been within normal range. Patient was also placed on BiPAP empirically given her mild hypercarbia, and also given some Narcan but her mental status still continues to be poor. Hospitalist services were contacted further treatment evaluation. Patient is arousable to a mild sternal rub but remains confused and answers some questions appropriately but is a very poor historian presently.  PAST MEDICAL HISTORY:   Past Medical History:  Diagnosis Date  . Anxiety   . Diabetes mellitus without complication (HCC)   . Hyperlipidemia   . Hypertension     PAST SURGICAL HISTORY:   Past Surgical History:  Procedure Laterality Date  . CESAREAN SECTION    . CHOLECYSTECTOMY      SOCIAL HISTORY:   Social History   Tobacco Use  . Smoking status: Current Every Day Smoker    Packs/day: 0.50    Years: 40.00    Pack years: 20.00    Types: Cigarettes  . Smokeless tobacco:  Never Used  Substance Use Topics  . Alcohol use: Yes    Comment: occasionly    FAMILY HISTORY:  No family history on file.  DRUG ALLERGIES:   Allergies  Allergen Reactions  . Sulfa Antibiotics Rash    REVIEW OF SYSTEMS:   Review of Systems  Unable to perform ROS: Mental acuity    MEDICATIONS AT HOME:   Prior to Admission medications   Medication Sig Start Date End Date Taking? Authorizing Provider  glipiZIDE (GLUCOTROL) 5 MG tablet Take 5 mg by mouth 2 (two) times daily before a meal.   Yes [provider]  clonazePAM (KLONOPIN) 0.5 MG tablet Take 1 tablet (0.5 mg total) by mouth 2 (two) times daily as needed for anxiety. Patient not taking: Reported on 06/18/2017 03/15/15   Beers, Charmayne Sheerharles M, PA-C  diclofenac (VOLTAREN) 75 MG EC tablet Take 1 tablet (75 mg total) by mouth 2 (two) times daily. Patient not taking: Reported on 06/18/2017 03/15/15   Beers, Charmayne Sheerharles M, PA-C  gabapentin (NEURONTIN) 300 MG capsule Take 600 mg by mouth 2 (two) times daily. 03/13/17   [provider]  hydrochlorothiazide (HYDRODIURIL) 12.5 MG tablet Take 1 tablet by mouth daily. 03/13/17   [provider]  HYDROcodone-acetaminophen (NORCO) 5-325 MG tablet Take 1-2 tablets by mouth every 4 (four) hours as needed for moderate pain. Patient not taking: Reported on 06/18/2017 03/15/15   Evangeline DakinBeers, Charles M, PA-C  hydrOXYzine (ATARAX/VISTARIL) 50 MG tablet Take 25-50 tablets by mouth at  bedtime.  03/13/17   [provider]  metFORMIN (GLUCOPHAGE) 500 MG tablet Take 1 tablet by mouth 2 (two) times daily with a meal.  03/13/17   [provider]  PARoxetine (PAXIL) 40 MG tablet Take 60 mg by mouth daily. 03/13/17   [provider]  predniSONE (DELTASONE) 50 MG tablet Take one 50 mg tablet once a day for 5 days. Patient not taking: Reported on 06/18/2017 06/17/17   Orvil Feil, PA-C  rOPINIRole (REQUIP) 1 MG tablet Take 1 tablet by mouth 2 (two) times daily. 03/13/17    [provider]      VITAL SIGNS:  Blood pressure 119/78, pulse 79, temperature 98.5 F (36.9 C), temperature source Oral, resp. rate 16, SpO2 100 %.  PHYSICAL EXAMINATION:  Physical Exam  GENERAL:  62 y.o.-year-old unkempt patient lying in bed in no acute distress.  EYES: Pupils are pinpoint but reactive to light. No scleral icterus. Extraocular muscles intact.  HEENT: Head atraumatic, normocephalic. Oropharynx and nasopharynx clear. No oropharyngeal erythema, dry oral mucosa  NECK:  Supple, no jugular venous distention. No thyroid enlargement, no tenderness.  LUNGS: Normal breath sounds bilaterally, no wheezing, rales, rhonchi. No use of accessory muscles of respiration.  CARDIOVASCULAR: S1, S2 RRR. No murmurs, rubs, gallops, clicks.  ABDOMEN: Soft, nontender, nondistended. Bowel sounds present. No organomegaly or mass.  EXTREMITIES: No pedal edema, cyanosis, or clubbing. + 2 pedal & radial pulses b/l.   NEUROLOGIC: Cranial nerves II through XII are intact. No focal Motor or sensory deficits appreciated b/l. Globally weak. PSYCHIATRIC: The patient is alert and oriented x 1.  SKIN: No obvious rash, lesion, or ulcer.   LABORATORY PANEL:   CBC Recent Labs  Lab 06/18/17 1051  WBC 10.4  HGB 13.1  HCT 38.5  PLT 266   ------------------------------------------------------------------------------------------------------------------  Chemistries  Recent Labs  Lab 06/18/17 1051  NA 136  K 3.9  CL 97*  CO2 28  GLUCOSE 366*  BUN 24*  CREATININE 1.31*  CALCIUM 9.7  AST 26  ALT 15  ALKPHOS 88  BILITOT 0.5   ------------------------------------------------------------------------------------------------------------------  Cardiac Enzymes No results for input(s): TROPONINI in the last 168 hours. ------------------------------------------------------------------------------------------------------------------  RADIOLOGY:  Dg Chest 1 View  Result Date:  06/18/2017 CLINICAL DATA:  Recent fall EXAM: CHEST 1 VIEW COMPARISON:  None. FINDINGS: The heart size and mediastinal contours are within normal limits. Both lungs are clear. The visualized skeletal structures are unremarkable. IMPRESSION: No active disease. Electronically Signed   By: Alcide Clever M.D.   On: 06/18/2017 12:00   Ct Head Wo Contrast  Result Date: 06/18/2017 CLINICAL DATA:  Altered level of consciousness. EXAM: CT HEAD WITHOUT CONTRAST TECHNIQUE: Contiguous axial images were obtained from the base of the skull through the vertex without intravenous contrast. COMPARISON:  CT scan of January 05, 2013. FINDINGS: Brain: No evidence of acute infarction, hemorrhage, hydrocephalus, extra-axial collection or mass lesion/mass effect. Vascular: No hyperdense vessel or unexpected calcification. Skull: Normal. Negative for fracture or focal lesion. Sinuses/Orbits: No acute finding. Other: None. IMPRESSION: Normal head CT. Electronically Signed   By: Lupita Raider, M.D.   On: 06/18/2017 12:39     IMPRESSION AND PLAN:   62 year old female with past medical history of diabetes, hypertension, anxiety, hyperlipidemia who presents to the hospital due to altered mental status.  1. Altered mental status-etiology unclear presently. Patient has had an extensive workup including CT head, urinalysis, ammonia level, ABG which have all been unremarkable. -Patient's urine drug screen is  only positive for tricyclic antidepressants. Patient was given some Narcan without much improvement in her symptoms, she was also placed on BiPAP empirically given her mild hypercarbia but despite that she continues to be lethargic and encephalopathic. -Questionable related to mild acute kidney injury, I will check a serum drug screen. Follow mental status. -Await MRI of the brain and MRI of the lumbar spine which has been ordered by the ER physician as patient had some urinary retention also some problem with moving her left lower  extremity.  2. Diabetes type 2 without complication-hold metformin. No hypoglycemia. Place on sliding scale insulin.  3. Acute kidney injury-secondary to dehydration. Hold hydrochlorothiazide, gently hydrated with IV fluids, follow BUN and creatinine and urine output.  4. Diabetic neuropathy-hold gabapentin presently given her lethargy and altered mental status.  5. Restless leg syndrome-continue Requip.  6. Anxiety-continue Paxil, hold Klonopin given her mental status change.    All the records are reviewed and case discussed with ED provider. Management plans discussed with the patient, family and they are in agreement.  CODE STATUS: Full code  TOTAL TIME TAKING CARE OF THIS PATIENT: 45 minutes.    Houston Siren M.D on 06/18/2017 at 6:04 PM  Between 7am to 6pm - Pager - [phone]  After 6pm go to [email or web address] - password [email or web address] [facility]  [place] [facility]  Office  [phone]  CC: Primary care physician; Hillery Aldo, MD

## 2017-06-18 NOTE — ED Notes (Signed)
Rn notified Resp of order for Bi-Pap.

## 2017-06-18 NOTE — ED Notes (Signed)
Pt is off Bi-Pap per verbal order from EDP McShane. Pt is will open eyes and respond verbally then doses right back off. Pt is oriented to her name at that is all. RN will make EDP aware at this time.

## 2017-06-18 NOTE — ED Notes (Signed)
RN attempted to call report at this time.

## 2017-06-18 NOTE — ED Notes (Signed)
Patient transported to MRI 

## 2017-06-18 NOTE — ED Notes (Signed)
This RN notified Resp of VBG being sent.

## 2017-06-18 NOTE — ED Notes (Signed)
Patient transported to CT 

## 2017-06-18 NOTE — Telephone Encounter (Signed)
Pt found sitting in ED lobby after being d/c yesterday; pt reports that she is "waiting for the orthopedic doctor to come and speak with her"; explained to pt that she has been evaluated by our ED provider and given d/c instructions; informed that she will need to f/u with her doctor as instructed, that the orthopedic doctor will not be coming to speak with her here; pt st that her husband is a truck driver and told her to stay here until she is seen; again explained to pt that she will not see a f/u doctor in the ED; pt offered taxi voucher to get transport home and agrees to such; [address]; [employer] called for transport

## 2017-06-18 NOTE — ED Notes (Addendum)
Bladder scanned showed pt retaining 715 ml of urine. EDP aware

## 2017-06-18 NOTE — ED Notes (Signed)
Pt placed on 2L [place] at this time due to sat of 89%. Pt is disoriented and is arousal to verbal command, but falls back asleep with in mins. EDP aware and at bedside.

## 2017-06-18 NOTE — ED Notes (Signed)
This [facility]. This RN notified her that pt is not on Bi pap at this time.

## 2017-06-18 NOTE — ED Notes (Signed)
Verbal order received for 0.5 mg of Narcan by MD McShane. 0.5 mg of Narcan given at this time. VS stable ..RN at bedside

## 2017-06-18 NOTE — ED Notes (Signed)
Pt repositioned, and is more awake at this time. Pt is requesting coffee EDP at bedside whom approves. This RN gave coffee to pt.

## 2017-06-18 NOTE — ED Notes (Signed)
Verbal for 1mg  of narcan to be admin. RN will administer at this time. Vs are WNL

## 2017-06-18 NOTE — ED Triage Notes (Signed)
Pt to ED via EMS , found walking to the bus and fell, ems was called by [facility] in [place]. Pt was seen in ED last night and discharged. Pt is confused, VS stable. cbg 460s per ems.

## 2017-06-18 NOTE — ED Provider Notes (Addendum)
[facility] Emergency Department Provider Note  ____________________________________________   I have reviewed the triage vital signs and the nursing notes. Where available I have reviewed prior notes and, if possible and indicated, outside hospital notes.    HISTORY  Chief Complaint Altered Mental Status and Hyperglycemia    HPI Christine Buchanan is a 62 y.o. female history of chronic pain, history of alcohol but states he has not for a year, history of chronic back pain, history of sciatica, history of diabetes mellitus, seen here yesterday for back pain started on prednisone, she comes back today after a fall.  She states she tripped.  She is a very poor historian however.  She seems somewhat confused, she will answer questions but she is very somnolent, easily arousable to verbal stimuli, she states that she has chronic back pain but denies any other particular complaints, denies any narcotic or other drug abuse denies drinking alcohol today.  States she just feels tired.  Level 5 chart caveat; no further history available due to patient status.    Past Medical History:  Diagnosis Date  . Anxiety   . Diabetes mellitus without complication (HCC)   . Hyperlipidemia   . Hypertension     There are no active problems to display for this patient.   Past Surgical History:  Procedure Laterality Date  . CESAREAN SECTION    . CHOLECYSTECTOMY      Prior to Admission medications   Medication Sig Start Date End Date Taking? Authorizing Provider  hydrochlorothiazide (HYDRODIURIL) 12.5 MG tablet Take 1 tablet by mouth daily. 03/13/17  Yes [provider]  clonazePAM (KLONOPIN) 0.5 MG tablet Take 1 tablet (0.5 mg total) by mouth 2 (two) times daily as needed for anxiety. 03/15/15   Beers, Charmayne Sheer, PA-C  clonazePAM (KLONOPIN) 1 MG tablet Take 1.5 mg by mouth daily. 03/31/17   [provider]  diclofenac (VOLTAREN) 75 MG EC tablet Take 1 tablet  (75 mg total) by mouth 2 (two) times daily. 03/15/15   Beers, Charmayne Sheer, PA-C  gabapentin (NEURONTIN) 300 MG capsule Take 600 mg by mouth 2 (two) times daily. 03/13/17   [provider]  glipiZIDE (GLUCOTROL) 5 MG tablet Take 1 tablet by mouth 2 (two) times daily. 03/13/17   [provider]  HYDROcodone-acetaminophen (NORCO) 5-325 MG tablet Take 1-2 tablets by mouth every 4 (four) hours as needed for moderate pain. 03/15/15   Beers, Charmayne Sheer, PA-C  hydrOXYzine (ATARAX/VISTARIL) 25 MG tablet Take 1 tablet by mouth daily. 03/13/17   [provider]  metFORMIN (GLUCOPHAGE) 500 MG tablet Take 1 tablet by mouth daily. 03/13/17   [provider]  PARoxetine (PAXIL) 40 MG tablet Take 60 mg by mouth daily. 03/13/17   [provider]  predniSONE (DELTASONE) 50 MG tablet Take one 50 mg tablet once a day for 5 days. 06/17/17   Orvil Feil, PA-C  rOPINIRole (REQUIP) 1 MG tablet Take 1 tablet by mouth 2 (two) times daily. 03/13/17   [provider]    Allergies Sulfa antibiotics  No family history on file.  Social History Social History   Tobacco Use  . Smoking status: Current Every Day Smoker    Packs/day: 0.50    Years: 40.00    Pack years: 20.00    Types: Cigarettes  . Smokeless tobacco: Never Used  Substance Use Topics  . Alcohol use: Yes    Comment: occasionly  . Drug use: No    Review  of Systems Constitutional: No fever/chills Eyes: No visual changes. ENT: No sore throat. No stiff neck no neck pain Cardiovascular: Denies chest pain. Respiratory: Denies shortness of breath. Gastrointestinal:   no vomiting.  No diarrhea.  No constipation. Genitourinary: Negative for dysuria. Musculoskeletal: Negative lower extremity swelling Skin: Negative for rash. Neurological: Negative for severe headaches, focal weakness or numbness. These of the answers the patient gives me very limited history however because of patient's  somnolence  ____________________________________________   PHYSICAL EXAM:  VITAL SIGNS: ED Triage Vitals [06/18/17 1039]  Enc Vitals Group     BP (!) 146/86     Pulse Rate 97     Resp 16     Temp 98.5 F (36.9 C)     Temp Source Oral     SpO2 96 %     Weight      Height      Head Circumference      Peak Flow      Pain Score 7     Pain Loc      Pain Edu?      Excl. in GC?     Constitutional: Somnolent but easily arousable to verbal stimuli, does not require intubation at this time for airway control, no acute distress Eyes: Conjunctivae are normal Head: Atraumatic HEENT: No congestion/rhinnorhea. Mucous membranes are moist.  Oropharynx non-erythematous Neck:   Nontender with no meningismus, no masses, no stridor Cardiovascular: Normal rate, regular rhythm. Grossly normal heart sounds.  Good peripheral circulation. Respiratory: Normal respiratory effort.  No retractions. Lungs CTAB. Abdominal: Soft and nontender. No distention. No guarding no rebound Back: Diffuse tenderness is reported pretty much anywhere I touch anywhere in her lower back with no significant focality step off.  there is no specific midline tenderness there are no lesions noted. there is no CVA tenderness Musculoskeletal: No lower extremity tenderness, no upper extremity tenderness. No joint effusions, no DVT signs strong distal pulses no edema Neurologic: Somewhat limited neurologic exam because of patient compliance no obvious cranial nerve deficit noted, speech is slow but she is quite somnolent, good equal grip strength, follows command, seems to have less strength in the left lower extremity than in the right but she states that because of her sciatica and it hurts to lift her leg.  Pulses are intact.  No obvious trauma noted. Skin:  Skin is warm, dry and intact. No rash noted. Psychiatric: Mood and affect are normal. Speech and behavior are normal.  ____________________________________________    LABS (all labs ordered are listed, but only abnormal results are displayed)  Labs Reviewed  COMPREHENSIVE METABOLIC PANEL  CBC  GLUCOSE, CAPILLARY  URINALYSIS, COMPLETE (UACMP) WITH MICROSCOPIC  URINE DRUG SCREEN, QUALITATIVE ([facility] ONLY)  ETHANOL  CBG MONITORING, ED    Pertinent labs  results that were available during my care of the patient were reviewed by me and considered in my medical decision making (see chart for details). ____________________________________________  EKG  I personally interpreted any EKGs ordered by me or triage Sinus rhythm at 77 bpm, no acute ST elevation or depression, normal axis, no acute ischemic changes, QRS is 103, QTC 479. ____________________________________________  RADIOLOGY  Pertinent labs & imaging results that were available during my care of the patient were reviewed by me and considered in my medical decision making (see chart for details). If possible, patient and/or family made aware of any abnormal findings.  No results found. ____________________________________________    PROCEDURES  Procedure(s) performed: None  Procedures  Critical  Care performed: None  ____________________________________________   INITIAL IMPRESSION / ASSESSMENT AND PLAN / ED COURSE  Pertinent labs & imaging results that were available during my care of the patient were reviewed by me and considered in my medical decision making (see chart for details).  Here with altered mental status, [place] was very broad, low suspicion for infection she is afebrile, a normal white count, however certainly not impossible, patient saturations are in the low 90s when I am in the room we put her on oxygen they came up, however this could be due to hypoventilation.  Patient certainly could be taking medication at home to treat her pain although she denies it, that certainly however given that there is a reported fall we will obtain a CT scan of the head.  She does have  difficulty moving that left leg at baseline she states.  Is hard to tell if there is any neurologic deficit there or if it is effort/pain related.  We will obviously not give her pain medication however given her altered level of mentation, we will check urine drug screen urinalysis salicylate level Tylenol level ethanol, urinalysis, chest x-ray CT head and reassess.  ----------------------------------------- 1:07 PM on 06/18/2017 -----------------------------------------  Patient in no acute distress, comfortably will wake up and talk to me, states she does not know where she she is but she still tell me her name and she tells me that she only took her gabapentin at home denies taking any other medications, denies fever chills or headache, patient states "I did not take anything else he can check my blood".  Nonetheless, she does appear to have some degree of toxidrome to me I do not think this is a meningitis normal white count known meningismus no headache no white count no fever etc.  We are giving her Narcan to see if it helps her wake up more we will continue to observe her in the emergency department.  Work and vital signs are all very reassuring at this time.    ----------------------------------------- 2:09 PM on 06/18/2017 -----------------------------------------  Continues somnolent but easily arousable, unclear exactly why, she states that she was on the way here to get her phone charger.  She states she was in a bar last night and stayed up late although it is not clear what exactly she did there.  Alcohol is negative.  Blood work and everything else are reassuring.  TCAs noted in her UDS but she absolutely denies overdose.  In any event, we will continue to monitor her and see if she wakes up, this still looks like a toxidrome to me.  I still do not think an LP is indicated.  We will however send ammonia level and a VBG to make sure she is not retaining CO2.  Low suspicion.  We have given  her IV fluids,  ----------------------------------------- 2:52 PM on 06/18/2017 -----------------------------------------  Remains nontoxic requesting to go home but still quite sleepy we will continue to observe, patient's CO2 is somewhat elevated, however her pH is normal this is likely chronic but I will try a short course of BiPAP to see if we can continue waking her up if this is contributing.  Ammonia is negative.  Remains nontoxic in all other respects  ----------------------------------------- 5:28 PM on 06/18/2017 -----------------------------------------  Limited neurologic exam patient is very sleepy, I will obtain an MRI of her head, she states that she cannot urinate, she is retaining urine at this time, unclear if that this is because of  a toxidrome or something related to her chronic back pain, will obtain an MRI of her head and back.  Patient will require admission.  Obviously, cauda equina syndrome would cause altered mental status, and altered mental status would not cause cauda equina syndrome, certainly could have had a CVA.     ____________________________________________   FINAL CLINICAL IMPRESSION(S) / ED DIAGNOSES  Final diagnoses:  None      This chart was dictated using voice recognition software.  Despite best efforts to proofread,  errors can occur which can change meaning.      Jeanmarie Plant, MD 06/18/17 1206    Jeanmarie Plant, MD 06/18/17 1309    Jeanmarie Plant, MD 06/18/17 1334    Jeanmarie Plant, MD 06/18/17 1411    Jeanmarie Plant, MD 06/18/17 1452    Jeanmarie Plant, MD 06/18/17 1525    Jeanmarie Plant, MD 06/18/17 1700    Jeanmarie Plant, MD 06/18/17 1728

## 2017-06-19 DIAGNOSIS — F1021 Alcohol dependence, in remission: Secondary | ICD-10-CM

## 2017-06-19 DIAGNOSIS — F330 Major depressive disorder, recurrent, mild: Secondary | ICD-10-CM

## 2017-06-19 DIAGNOSIS — F1411 Cocaine abuse, in remission: Secondary | ICD-10-CM

## 2017-06-19 DIAGNOSIS — F410 Panic disorder [episodic paroxysmal anxiety] without agoraphobia: Secondary | ICD-10-CM | POA: Diagnosis not present

## 2017-06-19 DIAGNOSIS — F339 Major depressive disorder, recurrent, unspecified: Secondary | ICD-10-CM

## 2017-06-19 DIAGNOSIS — F1211 Cannabis abuse, in remission: Secondary | ICD-10-CM

## 2017-06-19 LAB — GLUCOSE, CAPILLARY
GLUCOSE-CAPILLARY: 184 mg/dL — AB (ref 65–99)
GLUCOSE-CAPILLARY: 152 mg/dL — AB (ref 65–99)
Glucose-Capillary: 155 mg/dL — ABNORMAL HIGH (ref 65–99)
Glucose-Capillary: 143 mg/dL — ABNORMAL HIGH (ref 65–99)

## 2017-06-19 LAB — CBC
HCT: 38.2 % (ref 35.0–47.0)
HEMOGLOBIN: 12.9 g/dL (ref 12.0–16.0)
MCH: 33.6 pg (ref 26.0–34.0)
MCHC: 33.7 g/dL (ref 32.0–36.0)
MCV: 99.7 fL (ref 80.0–100.0)
PLATELETS: 227 10*3/uL (ref 150–440)
RBC: 3.83 MIL/uL (ref 3.80–5.20)
RDW: 12.6 % (ref 11.5–14.5)
WBC: 8.1 10*3/uL (ref 3.6–11.0)

## 2017-06-19 LAB — BASIC METABOLIC PANEL
ANION GAP: 6 (ref 5–15)
BUN: 17 mg/dL (ref 6–20)
CHLORIDE: 104 mmol/L (ref 101–111)
CO2: 28 mmol/L (ref 22–32)
Calcium: 8.8 mg/dL — ABNORMAL LOW (ref 8.9–10.3)
Creatinine, Ser: 1.06 mg/dL — ABNORMAL HIGH (ref 0.44–1.00)
GFR calc non Af Amer: 56 mL/min — ABNORMAL LOW (ref >60)
Glucose, Bld: 189 mg/dL — ABNORMAL HIGH (ref 65–99)
POTASSIUM: 3.9 mmol/L (ref 3.5–5.1)
SODIUM: 138 mmol/L (ref 135–145)

## 2017-06-19 MED ORDER — CITALOPRAM HYDROBROMIDE 20 MG PO TABS
20.0000 mg | ORAL_TABLET | Freq: Every day | ORAL | Status: DC
  Administered 2017-06-19 – 2017-06-20 (×2): 20 mg via ORAL
  Filled 2017-06-19 (×2): qty 1

## 2017-06-19 MED ORDER — PAROXETINE HCL 20 MG PO TABS
20.0000 mg | ORAL_TABLET | Freq: Every day | ORAL | Status: DC
  Administered 2017-06-20: 20 mg via ORAL
  Filled 2017-06-19: qty 1

## 2017-06-19 MED ORDER — MELATONIN 5 MG PO TABS
5.0000 mg | ORAL_TABLET | Freq: Every day | ORAL | Status: DC
  Administered 2017-06-19: 5 mg via ORAL
  Filled 2017-06-19 (×2): qty 1

## 2017-06-19 NOTE — Progress Notes (Signed)
Found patient with legs over the side of the bed; toileted patient; encouraged her to call for assistance; bed alarm set; callbell, possessions within reach; voiced understanding. Beyonce Sawatzky K, RN10:15 PM 06/19/2017

## 2017-06-19 NOTE — Progress Notes (Signed)
[facility] - [place] [facility] [address] [facility]   PATIENT NAME: Christine Buchanan    MR#:  [id number]  DATE OF BIRTH:  05-May-1956  SUBJECTIVE:  CHIEF COMPLAINT:   Chief Complaint  Patient presents with  . Altered Mental Status  . Hyperglycemia    Came with altered mental status. Recently started on medicines for her sciatic pain. Brought with encephalopathy. Urine for toxicology is negative and Narcan did not help. No source of infection. Was slightly dehydrated which improved with IV fluids. Much of alert today and oriented but still appears having some confusion while talking about the events. REVIEW OF SYSTEMS:  CONSTITUTIONAL: No fever, fatigue or weakness.  EYES: No blurred or double vision.  EARS, NOSE, AND THROAT: No tinnitus or ear pain.  RESPIRATORY: No cough, shortness of breath, wheezing or hemoptysis.  CARDIOVASCULAR: No chest pain, orthopnea, edema.  GASTROINTESTINAL: No nausea, vomiting, diarrhea or abdominal pain.  GENITOURINARY: No dysuria, hematuria.  ENDOCRINE: No polyuria, nocturia,  HEMATOLOGY: No anemia, easy bruising or bleeding SKIN: No rash or lesion. MUSCULOSKELETAL: No joint pain or arthritis.   NEUROLOGIC: No tingling, numbness, weakness.  PSYCHIATRY: No anxiety or depression.   ROS  DRUG ALLERGIES:   Allergies  Allergen Reactions  . Sulfa Antibiotics Rash    VITALS:  Blood pressure (!) 145/85, pulse 78, temperature 97.8 F (36.6 C), temperature source Oral, resp. rate 17, height 5\' 4"  (1.626 m), weight 86.2 kg (190 lb 1.6 oz), SpO2 98 %.  PHYSICAL EXAMINATION:  GENERAL:  62 y.o.-year-old patient lying in the bed with no acute distress.  EYES: Pupils equal, round, reactive to light and accommodation. No scleral icterus. Extraocular muscles intact.  HEENT: Head atraumatic, normocephalic. Oropharynx and nasopharynx clear.  NECK:  Supple, no jugular venous distention. No thyroid enlargement, no tenderness.  LUNGS: Normal breath sounds  bilaterally, no wheezing, rales,rhonchi or crepitation. No use of accessory muscles of respiration.  CARDIOVASCULAR: S1, S2 normal. No murmurs, rubs, or gallops.  ABDOMEN: Soft, nontender, nondistended. Bowel sounds present. No organomegaly or mass.  EXTREMITIES: No pedal edema, cyanosis, or clubbing.  NEUROLOGIC: Cranial nerves II through XII are intact. Muscle strength 4-5/5 in all extremities. Sensation intact. Gait not checked.  PSYCHIATRIC: The patient is alert and oriented x 2.  SKIN: No obvious rash, lesion, or ulcer.   Physical Exam LABORATORY PANEL:   CBC Recent Labs  Lab 06/19/17 0802  WBC 8.1  HGB 12.9  HCT 38.2  PLT 227   ------------------------------------------------------------------------------------------------------------------  Chemistries  Recent Labs  Lab 06/18/17 1051 06/19/17 0633  NA 136 138  K 3.9 3.9  CL 97* 104  CO2 28 28  GLUCOSE 366* 189*  BUN 24* 17  CREATININE 1.31* 1.06*  CALCIUM 9.7 8.8*  AST 26  --   ALT 15  --   ALKPHOS 88  --   BILITOT 0.5  --    ------------------------------------------------------------------------------------------------------------------  Cardiac Enzymes No results for input(s): TROPONINI in the last 168 hours. ------------------------------------------------------------------------------------------------------------------  RADIOLOGY:  Dg Chest 1 View  Result Date: 06/18/2017 CLINICAL DATA:  Recent fall EXAM: CHEST 1 VIEW COMPARISON:  None. FINDINGS: The heart size and mediastinal contours are within normal limits. Both lungs are clear. The visualized skeletal structures are unremarkable. IMPRESSION: No active disease. Electronically Signed   By: Alcide Clever M.D.   On: 06/18/2017 12:00   Dg Abd 1 View  Result Date: 06/18/2017 CLINICAL DATA:  MRI clearance. EXAM: ABDOMEN - 1 VIEW COMPARISON:  None. FINDINGS: Cholecystectomy clips in the  right upper quadrant. No other radiopaque foreign body or other  metallic density in the abdomen. No findings to preclude MRI imaging. Normal bowel gas pattern with moderate colonic stool burden. IMPRESSION: No findings to preclude MRI imaging. Electronically Signed   By: Rubye Oaks M.D.   On: 06/18/2017 21:44   Ct Head Wo Contrast  Result Date: 06/18/2017 CLINICAL DATA:  Altered level of consciousness. EXAM: CT HEAD WITHOUT CONTRAST TECHNIQUE: Contiguous axial images were obtained from the base of the skull through the vertex without intravenous contrast. COMPARISON:  CT scan of January 05, 2013. FINDINGS: Brain: No evidence of acute infarction, hemorrhage, hydrocephalus, extra-axial collection or mass lesion/mass effect. Vascular: No hyperdense vessel or unexpected calcification. Skull: Normal. Negative for fracture or focal lesion. Sinuses/Orbits: No acute finding. Other: None. IMPRESSION: Normal head CT. Electronically Signed   By: Lupita Raider, M.D.   On: 06/18/2017 12:39   Mr Brain Wo Contrast  Result Date: 06/18/2017 CLINICAL DATA:  62 y/o  F; status post fall and confusion. EXAM: MRI HEAD WITHOUT CONTRAST TECHNIQUE: Multiplanar, multiecho pulse sequences of the brain and surrounding structures were obtained without intravenous contrast. COMPARISON:  06/18/2016 CT head. FINDINGS: Brain: No acute infarction, hemorrhage, hydrocephalus, extra-axial collection or mass lesion. Incidental right tentorium cerebelli subcentimeter lipoma without mass effect. Few punctate foci of T2 FLAIR hyperintense signal abnormality in frontal white matter compatible with minimal chronic microvascular ischemic changes. Vascular: Normal flow voids. Skull and upper cervical spine: Normal marrow signal. Sinuses/Orbits: Right mastoid tip opacification. Bilateral maxillary sinus mucous retention cyst. Orbits are unremarkable. Other: None. IMPRESSION: 1. No acute intracranial abnormality. 2. Minimal chronic microvascular ischemic changes of white matter. Electronically Signed   By: Mitzi Hansen M.D.   On: 06/18/2017 22:41   Mr Lumbar Spine Wo Contrast  Result Date: 06/18/2017 CLINICAL DATA:  Back pain.  Cauda equina suspected. EXAM: MRI LUMBAR SPINE WITHOUT CONTRAST TECHNIQUE: Multiplanar, multisequence MR imaging of the lumbar spine was performed. No intravenous contrast was administered. COMPARISON:  None. FINDINGS: Segmentation:  Standard. Alignment:  Physiologic. Vertebrae:  No fracture, evidence of discitis, or bone lesion. Conus medullaris and cauda equina: Conus extends to the lower L2 level. Conus and cauda equina appear normal. Paraspinal and other soft tissues: No acute finding. Right renal cystic intensities. Disc levels: T11-12: Small right paracentral disc protrusion T12- L1: Unremarkable. L1-L2: Noncompressive left foraminal protrusion. L2-L3: Mild disc bulging. L3-L4: Mild disc narrowing with circumferential disc bulging. Shallow left extraforaminal disc protrusion without compressive stenosis. L4-L5: Advanced degenerative facet arthropathy with posterior element hypertrophy. The disc is mildly narrowed and bulging. Moderate to advanced spinal stenosis, advanced appearing on axial slices and more moderate on sagittal acquisition. Patent foramina L5-S1:Advanced degenerative facet arthropathy. No herniation or impingement. IMPRESSION: 1. No acute finding. 2. L4-5 high-grade spinal stenosis mainly due to advanced degenerative facet arthropathy. 3. Noncompressive disc protrusions at T11-12, L1-2, and L3-4 as described. 4. L5-S1 advanced facet arthropathy. Electronically Signed   By: Marnee Spring M.D.   On: 06/18/2017 22:46    ASSESSMENT AND PLAN:   Active Problems:   Altered mental status  62 year old female with past medical history of diabetes, hypertension, anxiety, hyperlipidemia who presents to the hospital due to altered mental status.  1. Altered mental status-etiology unclear presently. Patient has had an extensive workup including CT head, urinalysis,  ammonia level, ABG which have all been unremarkable. -Patient's urine drug screen is only positive for tricyclic antidepressants.   was given some Narcan without much  improvement in her symptoms, she was also placed on BiPAP empirically given her mild hypercarbia but despite that she continues to be lethargic and encephalopathic. -Questionable related to mild acute kidney injury,  - negative MRI of the brain and MRI of the lumbar spine - Much improved today after holding her psych medication and gabapentin. IV fluids helped with renal function. - Able to ambulate with help of nurse - As she still has some confusion and has no social issues, appears to have some underlying psych problem. I called psych consult and social worker to help with replacement.  2. Diabetes type 2 without complication-hold metformin. No hypoglycemia. Place on sliding scale insulin.  3. Acute kidney injury-secondary to dehydration. Hold hydrochlorothiazide, gently hydrated with IV fluids, follow BUN and creatinine and urine output. - Improved.  4. Diabetic neuropathy-hold gabapentin presently given her lethargy and altered mental status.  5. Restless leg syndrome-continue Requip.  6. Anxiety-continue Paxil, hold Klonopin given her mental status change.   All the records are reviewed and case discussed with Care Management/Social Workerr. Management plans discussed with the patient, family and they are in agreement.  CODE STATUS: full.  TOTAL TIME TAKING CARE OF THIS PATIENT: 35 minutes.   POSSIBLE D/C IN *1-2 DAYS, DEPENDING ON CLINICAL CONDITION.   Altamese DillingVaibhavkumar Naela Nodal M.D on 06/19/2017   Between 7am to 6pm - Pager - [phone]  After 6pm go to [email or web address] - password [email or web address]  [facility] [place] [facility]  Office  [phone]  CC: Primary care physician; Hillery AldoPatel, Sarah, MD  Note: This dictation was prepared with Dragon dictation along with smaller phrase technology. Any transcriptional  errors that result from this process are unintentional.

## 2017-06-19 NOTE — Progress Notes (Signed)
Patients cellphone located from ED and returned to patient.
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
## 2017-06-19 NOTE — Consult Note (Addendum)
[facility]   Reason for Consult:  Altered mental status Referring Physician:  Dolores Frame, MD Patient Identification: Christine Buchanan MRN:  [id number]   Principal Diagnosis: Major Depressive Disorder  Diagnosis:   Patient Active Problem List   Diagnosis Date Noted  . Altered mental status [R41.82] 06/18/2017    Priority: High  . Major depressive disorder, recurrent episode ([place]) [F33.9] 06/19/2017    Priority: Low  . Alcohol dependence in remission ([place]) [F10.21] 06/19/2017    Priority: Low  . Cocaine abuse, in remission [facility]) [F14.11] 06/19/2017    Priority: Low  . Cannabis abuse, in remission [F12.11] 06/19/2017    Priority: Low  . Panic disorder without agoraphobia [F41.0] 06/19/2017    Priority: Low    Total Time spent with patient: 45 minutes    History of present illness  Christine Buch[place] is a 62 year-old widowed African-American female with history of multiple medical conditions who is admitted to the medical floor secondary to altered mental status. Psychiatry was consult it as there was a suspicion of an underlying mental illness. The patient had just been discharged from the [place] emergency room. She had an extensive workup for altered mental status but no etiology could be found. The patient was given Narcan but her mental status poor. She does have prescriptions for Klonopin and opioids and was taking 60 mg of Paxil daily prior to hospitalization. The patient says that she does take the Klonopin consistently. In the past, she has been treated for mild depression, anxiety and panic attacks at [facility] and by her PCP, [place] [facility]. She does not see a psychiatrist on a regular basis.  During the exam today, the patient was fully alert and oriented to time, place, and situation. She was conversing appropriately and thought processes were logical and goal directed. She does have a history of  substance use in the past but says she has been sober from alcohol for the past one year. She used cocaine and marijuana back in her 61s and 30s but denies any recent use. Toxicology screen in the emergency room has not shown any illegal substances.  The patient reports that her depression has been the result of conflict with her son who lives in [place], [place]. She had wanted to move out there with him and all her stuff is out there but she got into some conflict with him and cannot live with them. She does not want to remain in [place] and is currently homeless. She was last at [place] says that he cannot stay there because of a status. He denies any current active or passive suicidal thoughts or psychotic symptoms. She did struggle with panic attacks in the past but denies any recent problems with panic attacks She denies any history of symptoms consistent with bipolar mania. She denies any prior inpatient psychiatric hospitalizations. She is currently on Paxil 60 mg by mouth daily when necessary Klonopin but no other psychotropic medications.    Past psychiatric history: The patient was on Zoloft in 2015 at [facility] and is currently on Paxil 60 mg by mouth daily, just increased from 40 mg in addition to when necessary Klonopin. She has been [facility] once in the past but does not see a psychiatrist regularly get psychotropic medications from PCP. She denies any prior inpatient psychiatric hospitalizations or suicide attempts.  Substance abuse history: The patient says she drank alcohol heavily  for 10 years but then quit in 08-25-2015. She smoked marijuana and cocaine in her 61s and 30s but denies any recent use. Toxicology screen was negative. She denies any opioid or stimulant use.  Social history: The patient was raised in [place] and moved to [place] in 08-24-1994. She is currently widowed as her husband died in 08/24/06. She has 1 son in [place] in the [place]. The patient has been on disability  for many years for back pain. 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 She last worked in 08-25-2011. She is homeless now for the past 3 years and has been staying at a shelter.  Legal history: The patient was arrested for DUI in the past and then in 08-24-2016 was arrested for communicating threats. Both charges   Risk to Self: Is patient at risk for suicide?: No Risk to Others:  No Prior Inpatient Therapy:    No Prior Outpatient Therapy:  Yes  Past Medical History:  Past Medical History:  Diagnosis Date  . Anxiety   . Diabetes mellitus without complication ([place])   . Hyperlipidemia   . Hypertension     Past Surgical History:  Procedure Laterality Date  . CESAREAN SECTION    . CHOLECYSTECTOMY      Social History:  Social History   Substance and Sexual Activity  Alcohol Use Yes   Comment: occasionly     Social History   Substance and Sexual Activity  Drug Use No    Social History   Socioeconomic History  . Marital status: Widowed    Spouse name: None  . Number of children: None  . Years of education: None  . Highest education level: None  Social Needs  . Financial resource strain: None  . Food insecurity - worry: None  . Food insecurity - inability: None  . Transportation needs - medical: None  . Transportation needs - non-medical: None  Occupational History  . None  Tobacco Use  . Smoking status: Current Every Day Smoker    Packs/day: 0.50    Years: 40.00    Pack years: 20.00    Types: Cigarettes  . Smokeless tobacco: Never Used  Substance and Sexual Activity  . Alcohol use: Yes    Comment: occasionly  . Drug use: No  . Sexual activity: None  Other Topics Concern  . None  Social History Narrative  . None   Additional Social History:    Allergies:   Allergies  Allergen Reactions  . Sulfa Antibiotics Rash    Labs:  Results for orders placed or performed during the [place] encounter of 06/18/17 (from the past  48 hour(s))  Glucose, capillary     Status: Abnormal   Collection Time: 06/18/17 10:45 AM  Result Value Ref Range   Glucose-Capillary 334 (H) 65 - 99 mg/dL  Comprehensive metabolic panel     Status: Abnormal   Collection Time: 06/18/17 10:51 AM  Result Value Ref Range   Sodium 136 135 - 145 mmol/L   Potassium 3.9 3.5 - 5.1 mmol/L   Chloride 97 (L) 101 - 111 mmol/L   CO2 28 22 - 32 mmol/L   Glucose, Bld 366 (H) 65 - 99 mg/dL   BUN 24 (H) 6 - 20 mg/dL   Creatinine, Ser 1.31 (H) 0.44 - 1.00 mg/dL   Calcium 9.7 8.9 - 10.3 mg/dL   Total Protein 8.4 (H) 6.5 - 8.1 g/dL   Albumin 4.1 3.5 -  5.0 g/dL   AST 26 15 - 41 U/L   ALT 15 14 - 54 U/L   Alkaline Phosphatase 88 38 - 126 U/L   Total Bilirubin 0.5 0.3 - 1.2 mg/dL   GFR calc non Af Amer 43 (L) >60 mL/min   GFR calc Af Amer 50 (L) >60 mL/min    Comment: (NOTE) The eGFR has been calculated using the CKD EPI equation. This calculation has not been validated in all clinical situations. eGFR's persistently <60 mL/min signify possible Chronic Kidney Disease.    Anion gap 11 5 - 15    Comment: Performed at [facility], [address] [place], [place] [address]  CBC     Status: None   Collection Time: 06/18/17 10:51 AM  Result Value Ref Range   WBC 10.4 3.6 - 11.0 K/uL   RBC 3.86 3.80 - 5.20 MIL/uL   Hemoglobin 13.1 12.0 - 16.0 g/dL   HCT 38.5 35.0 - 47.0 %   MCV 99.7 80.0 - 100.0 fL   MCH 33.8 26.0 - 34.0 pg   MCHC 33.9 32.0 - 36.0 g/dL   RDW 12.9 11.5 - 14.5 %   Platelets 266 150 - 440 K/uL    Comment: Performed at [facility], [address] [place], [place] [address]  Urinalysis, Complete w Microscopic     Status: Abnormal   Collection Time: 06/18/17 11:00 AM  Result Value Ref Range   Color, Urine STRAW (A) YELLOW   APPearance CLEAR (A) CLEAR   Specific Gravity, Urine 1.016 1.005 - 1.030   pH 6.0 5.0 - 8.0   Glucose, UA >=500 (A) NEGATIVE mg/dL   Hgb urine dipstick SMALL (A) NEGATIVE   Bilirubin  Urine NEGATIVE NEGATIVE   Ketones, ur NEGATIVE NEGATIVE mg/dL   Protein, ur NEGATIVE NEGATIVE mg/dL   Nitrite NEGATIVE NEGATIVE   Leukocytes, UA SMALL (A) NEGATIVE   RBC / HPF 6-30 0 - 5 RBC/hpf   WBC, UA 6-30 0 - 5 WBC/hpf   Bacteria, UA NONE SEEN NONE SEEN   Squamous Epithelial / LPF 0-5 (A) NONE SEEN    Comment: Performed at [facility], [address], [place], [place] [address]  Urine Drug Screen, Qualitative     Status: Abnormal   Collection Time: 06/18/17 11:00 AM  Result Value Ref Range   Tricyclic, Ur Screen POSITIVE (A) NONE DETECTED   Amphetamines, Ur Screen NONE DETECTED NONE DETECTED   MDMA (Ecstasy)Ur Screen NONE DETECTED NONE DETECTED   Cocaine Metabolite,Ur [place] NONE DETECTED NONE DETECTED   Opiate, Ur Screen NONE DETECTED NONE DETECTED   Phencyclidine (PCP) Ur S NONE DETECTED NONE DETECTED   Cannabinoid 50 Ng, Ur [place] NONE DETECTED NONE DETECTED   Barbiturates, Ur Screen NONE DETECTED NONE DETECTED   Benzodiazepine, Ur Scrn NONE DETECTED NONE DETECTED   Methadone Scn, Ur NONE DETECTED NONE DETECTED    Comment: (NOTE) Tricyclics + metabolites, urine    Cutoff 1000 ng/mL Amphetamines + metabolites, urine  Cutoff 1000 ng/mL MDMA (Ecstasy), urine              Cutoff 500 ng/mL Cocaine Metabolite, urine          Cutoff 300 ng/mL Opiate + metabolites, urine        Cutoff 300 ng/mL Phencyclidine (PCP), urine         Cutoff 25 ng/mL Cannabinoid, urine                 Cutoff 50 ng/mL  Barbiturates + metabolites, urine  Cutoff 200 ng/mL Benzodiazepine, urine              Cutoff 200 ng/mL Methadone, urine                   Cutoff 300 ng/mL The urine drug screen provides only a preliminary, unconfirmed analytical test result and should not be used for non-medical purposes. Clinical consideration and professional judgment should be applied to any positive drug screen result due to possible interfering substances. A more specific alternate chemical method must be used  in order to obtain a confirmed analytical result. Gas chromatography / mass spectrometry (GC/Christine) is the preferred confirmat ory method. Performed at [facility], [address] [place], [place] [address]   Ethanol     Status: None   Collection Time: 06/18/17 11:26 AM  Result Value Ref Range   Alcohol, Ethyl (B) <10 <10 mg/dL    Comment:        LOWEST DETECTABLE LIMIT FOR SERUM ALCOHOL IS 10 mg/dL FOR MEDICAL PURPOSES ONLY Performed at [facility], [address] [place], [place] [address]   Acetaminophen level     Status: Abnormal   Collection Time: 06/18/17 11:26 AM  Result Value Ref Range   Acetaminophen (Tylenol), Serum <10 (L) 10 - 30 ug/mL    Comment:        THERAPEUTIC CONCENTRATIONS VARY SIGNIFICANTLY. A RANGE OF 10-30 ug/mL MAY BE AN EFFECTIVE CONCENTRATION FOR MANY PATIENTS. HOWEVER, SOME ARE BEST TREATED AT CONCENTRATIONS OUTSIDE THIS RANGE. ACETAMINOPHEN CONCENTRATIONS >150 ug/mL AT 4 HOURS AFTER INGESTION AND >50 ug/mL AT 12 HOURS AFTER INGESTION ARE OFTEN ASSOCIATED WITH TOXIC REACTIONS. Performed at [facility], [address] [place], [place] [address]   Salicylate level     Status: None   Collection Time: 06/18/17 11:26 AM  Result Value Ref Range   Salicylate Lvl <7.7 2.8 - 30.0 mg/dL    Comment: Performed at [facility], [address] [place], [place] [address]  Ammonia     Status: None   Collection Time: 06/18/17  2:09 PM  Result Value Ref Range   Ammonia 20 9 - 35 umol/L    Comment: Performed at [facility], [address] [place], [place] [address]  Blood gas, venous     Status: Abnormal (Preliminary result)   Collection Time: 06/18/17  2:09 PM  Result Value Ref Range   FIO2 PENDING    pH, Ven 7.37 7.250 - 7.430   pCO2, Ven 58 44.0 - 60.0 mmHg   pO2, Ven 59.0 (H) 32.0 - 45.0 mmHg   Bicarbonate 33.5 (H) 20.0 - 28.0 mmol/L   Acid-Base Excess 6.6 (H) 0.0 - 2.0 mmol/L   O2 Saturation  89.3 %   Patient temperature 37.0     Comment: Performed at [facility], [address] [place], [place] [address]   Collection site PENDING    Sample type PENDING    Mechanical Rate PENDING   Glucose, capillary     Status: Abnormal   Collection Time: 06/18/17  5:53 PM  Result Value Ref Range   Glucose-Capillary 174 (H) 65 - 99 mg/dL  Glucose, capillary     Status: None   Collection Time: 06/18/17  9:28 PM  Result Value Ref Range   Glucose-Capillary 86 65 - 99 mg/dL  Basic metabolic panel     Status: Abnormal   Collection Time: 06/19/17  6:33 AM  Result Value Ref Range  Sodium 138 135 - 145 mmol/L   Potassium 3.9 3.5 - 5.1 mmol/L   Chloride 104 101 - 111 mmol/L   CO2 28 22 - 32 mmol/L   Glucose, Bld 189 (H) 65 - 99 mg/dL   BUN 17 6 - 20 mg/dL   Creatinine, Ser 1.06 (H) 0.44 - 1.00 mg/dL   Calcium 8.8 (L) 8.9 - 10.3 mg/dL   GFR calc non Af Amer 56 (L) >60 mL/min   GFR calc Af Amer >60 >60 mL/min    Comment: (NOTE) The eGFR has been calculated using the CKD EPI equation. This calculation has not been validated in all clinical situations. eGFR's persistently <60 mL/min signify possible Chronic Kidney Disease.    Anion gap 6 5 - 15    Comment: Performed at [facility], [address] [place], [place] [address]  Glucose, capillary     Status: Abnormal   Collection Time: 06/19/17  7:38 AM  Result Value Ref Range   Glucose-Capillary 155 (H) 65 - 99 mg/dL   Comment 1 Notify RN   CBC     Status: None   Collection Time: 06/19/17  8:02 AM  Result Value Ref Range   WBC 8.1 3.6 - 11.0 K/uL   RBC 3.83 3.80 - 5.20 MIL/uL   Hemoglobin 12.9 12.0 - 16.0 g/dL   HCT 38.2 35.0 - 47.0 %   MCV 99.7 80.0 - 100.0 fL   MCH 33.6 26.0 - 34.0 pg   MCHC 33.7 32.0 - 36.0 g/dL   RDW 12.6 11.5 - 14.5 %   Platelets 227 150 - 440 K/uL    Comment: Performed at [facility], [address] [place], [place] [address]  Glucose, capillary     Status: Abnormal    Collection Time: 06/19/17 11:45 AM  Result Value Ref Range   Glucose-Capillary 184 (H) 65 - 99 mg/dL  Glucose, capillary     Status: Abnormal   Collection Time: 06/19/17  4:34 PM  Result Value Ref Range   Glucose-Capillary 152 (H) 65 - 99 mg/dL   Comment 1 Notify RN   Glucose, capillary     Status: Abnormal   Collection Time: 06/19/17  9:15 PM  Result Value Ref Range   Glucose-Capillary 143 (H) 65 - 99 mg/dL    Current Facility-Administered Medications  Medication Dose Route Frequency Provider Last Rate Last Dose  . 0.9 %  sodium chloride infusion   Intravenous Continuous Henreitta Leber, MD 100 mL/hr at 06/19/17 1722    . acetaminophen (TYLENOL) tablet 650 mg  650 mg Oral Q6H PRN Henreitta Leber, MD   650 mg at 06/18/17 2316   Or  . acetaminophen (TYLENOL) suppository 650 mg  650 mg Rectal Q6H PRN Henreitta Leber, MD      . citalopram (CELEXA) tablet 20 mg  20 mg Oral Daily Chauncey Mann, MD      . heparin injection 5,000 Units  5,000 Units Subcutaneous Q8H Henreitta Leber, MD   5,000 Units at 06/19/17 1414  . insulin aspart (novoLOG) injection 0-5 Units  0-5 Units Subcutaneous QHS Sainani, Vivek J, MD      . insulin aspart (novoLOG) injection 0-9 Units  0-9 Units Subcutaneous TID WC Henreitta Leber, MD   2 Units at 06/19/17 1722  . ondansetron (ZOFRAN) tablet 4 mg  4 mg Oral Q6H PRN Henreitta Leber, MD       Or  . ondansetron [facility]) injection 4 mg  4 mg Intravenous Q6H PRN Henreitta Leber, MD      . Derrill Memo ON 06/20/2017] PARoxetine (PAXIL) tablet 20 mg  20 mg Oral Daily Chauncey Mann, MD      . rOPINIRole (REQUIP) tablet 1 mg  1 mg Oral BID Henreitta Leber, MD   1 mg at 06/19/17 1044    Musculoskeletal: Did not examine  Psychiatric Specialty Exam: Physical Exam: see Hospitalist note  ROS: see Hospitalist note  Blood pressure 125/64, pulse 73, temperature 98.2 F (36.8 C), resp. rate 19, height _0  (1.626 m), weight 86.2 kg (190 lb 1.6 oz), SpO2 94 %.Body mass index  is 32.63 kg/m.  General Appearance: Casual  Eye Contact:  Fair  Speech:  Clear and Coherent and Normal Rate  Volume:  Decreased  Mood:  Depressed  Affect:  Depressed  Thought Process:  Coherent, Goal Directed and Linear  Orientation:  Full (Time, Place, and Person)  Thought Content:  Logical  Suicidal Thoughts:  No  Homicidal Thoughts:  No  Memory:  Immediate;   Good Recent;   Good Remote;   Good  Judgement:  Fair  Insight:  Fair  Psychomotor Activity:  Decreased  Concentration:  Concentration: Fair and Attention Span: Fair  Recall:  [employer] of Knowledge:  Fair  Language:  Good  Akathisia:  No  Handed:  Right  AIMS (if indicated):     Assets:  Communication Skills  ADL's:  Intact  Cognition:  WNL  Sleep:        Treatment Plan Summary:  [place] Major depressive disorder, mild to moderate without psychotic features History of panic disorder without agoraphobia Alcohol use disorder in full remission Cannabis use Disorder in full remission Cocaine use disorder in full remission Diabetes mellitus Hyperlipidemia Hypertension Severe: Multiple medical conditions, homeless, financial problems, on disability   Christine. Liska is a 62 year old widowed African-American female with a history of recurrent depression, anxiety and panic disorder. She was admitted to the medicine service secondary to altered mental status. Toxicology screen was negative for all illicit drugs. I am suspicious that the patient may have had some benzodiazepine withdrawal symptoms as toxicology screen was negative and she does use benzodiazepines consistently. Another contributing factor may be the recent increase in Paxil. The patient had Paxil increased from 40 mg to 60 mg. Anticholinergic effects of Paxil can contribute to confusion in the elderly. Paxil was not an ideal medication for her. While she does endorse depressive symptoms, she denies any current active or passive suicidal thoughts or psychosis. No  history of symptoms of bipolar mania.  Major depressive disorder, recurrent, history of panic disorder: Will plan to titrate down off the Paxil and start Celexa. Will decrease Paxil to total 20 mg by mouth daily for the next week and start Celexa 20 mg by mouth daily. Will not prescribe any benzodiazepines secondary to the risk of cognitive impairment with benzos in elderly. In addition, the patient does have a prescription for opiates and opiates should not be mixed with benzodiazepines.  Alcohol use disorder in full remission, cocaine and cannabis use disorder formation: The patient was advised to abstain from alcohol and all illicit drugs as they may worsen mood symptoms. Toxicology screen was negative for all substances and she says she stopped using alcohol more than one year ago. Hypertension and diabetes: Deferred treatment medicine  Disposition: The patient is not currently endorsing any active or passive suicidal thoughts and is not in imminent danger to herself or  others at this time this time necessitating inpatient psychiatric treatment.         Daily contact with patient to assess and evaluate symptoms and progress in treatment and Medication management    Chauncey Mann, MD 06/19/2017 9:57 PM

## 2017-06-19 NOTE — Clinical Social Work Note (Addendum)
CSW received consult that patient is homeless. CSW will assess when able.  CSW received call from the attending RN that the patient is discharging today and cannot return to the homeless shelter. The patient has a "friend" named [place] who is willing to assist. CSW has attempted to contact [place], and the call went straight to voicemail. CSW has left HIPPA compliant voice mail and is awaiting return call.  CSW received call back and updated via assessment. The CSW spoke with the patient at bedside. She was intermittently awake and was oriented to time (day of week), place (first [place]" to question "What city are you in?", but was able to answer correctly when asked again"), and self. She seems confused to her situation. According to past chart notes, the patient seems to believe her husband is still living and coming back for her; however, her social support confirms that the patient's husband passed away some years ago. CSW is waiting for psych consult to determine next steps.  [place], MSW, Theresia MajorsLCSWA [phone]

## 2017-06-20 LAB — GLUCOSE, CAPILLARY
GLUCOSE-CAPILLARY: 189 mg/dL — AB (ref 65–99)
Glucose-Capillary: 150 mg/dL — ABNORMAL HIGH (ref 65–99)

## 2017-06-20 MED ORDER — CITALOPRAM HYDROBROMIDE 20 MG PO TABS: 20.0000 mg | ORAL_TABLET | Freq: Every day | ORAL | 0 refills | Status: DC

## 2017-06-20 MED ORDER — PAROXETINE HCL 20 MG PO TABS: 20.0000 mg | ORAL_TABLET | Freq: Every day | ORAL | 0 refills | Status: AC

## 2017-06-20 MED ORDER — GABAPENTIN 100 MG PO CAPS: 100.0000 mg | ORAL_CAPSULE | Freq: Two times a day (BID) | ORAL | 0 refills | Status: AC

## 2017-06-20 NOTE — Clinical Social Work Note (Addendum)
CSW has attempted to contact the patient's friend Christine Buchanan to check on progress with locating a temporary place for the patient to stay after discharge. CSW left HIPPA compliant voice mail and is awaiting a call back.  CSW spoke with the patient and empowered her to use her disability funds to secure shelter. The patient plans on contacting Ms. Buchanan to discuss options. CSW has given the patient's RN a taxi voucher and specified that the address must be in the [place] area. The patient understood and will update her nurse with a safe discharge address.  CSW is signing off. Please consult should needs arise.  Christine Buchanan, MSW, Theresia MajorsLCSWA [phone]

## 2017-06-20 NOTE — Progress Notes (Addendum)
MD order to discharge patient home. IV has been removed. Discharge instructions, prescriptions and instructions for follow up appointment was reviewed with patient. Prior to receiving discharge instructions, patient requested to go out to smoke. She was advised that she could not smoke and her response was that she "is 62 years old" and staff "can't tell her what she can or can not do," and left the floor anyway. When she returned she received her discharge instructions. Social Worker has provided a [employer] voucher.

## 2017-06-20 NOTE — Discharge Summary (Signed)
[facility] [place] [facility]   PATIENT NAME: Christine SodaKathryn Buchanan    MR#:  [id number]  DATE OF BIRTH:  03/25/1956  DATE OF ADMISSION:  06/18/2017 ADMITTING PHYSICIAN: Houston SirenVivek J Sainani, MD  DATE OF DISCHARGE: 06/20/2017  PRIMARY CARE PHYSICIAN: Hillery AldoPatel, Sarah, MD    ADMISSION DIAGNOSIS:  Altered mental status, unspecified altered mental status type [R41.82]  DISCHARGE DIAGNOSIS:  Active Problems:   Altered mental status   Major depressive disorder, recurrent episode (HCC)   Alcohol dependence in remission (HCC)   Cocaine abuse, in remission (HCC)   Cannabis abuse, in remission   Panic disorder without agoraphobia   SECONDARY DIAGNOSIS:   Past Medical History:  Diagnosis Date  . Anxiety   . Diabetes mellitus without complication (HCC)   . Hyperlipidemia   . Hypertension     HOSPITAL COURSE:   62 year old female with past medical history of diabetes, hypertension, anxiety, hyperlipidemia who presents to the hospital due to altered mental status.  1. Altered mental status-etiology unclear presently.   most likely medication related, as patient was on multiple medications including gabapentin, Paxil, opioid, klonazepam.  Patient has had an extensive workup including CT head, urinalysis, ammonia level, ABG which have all been unremarkable. -Patient's urine drug screen is only positive for tricyclic antidepressants.   was given some Narcan without much improvement in her symptoms, she was also placed on BiPAP empirically given her mild hypercarbia but despite that she continues to be lethargic and encephalopathic. -Questionable related to mild acute kidney injury,  - negative MRI of the brain and MRI of the lumbar spine - Much improved today after holding her psych medication and gabapentin. IV fluids helped with renal function. - Able to ambulate with help of nurse - As she has some social issues, appears to have some underlying psych problem. I  called psych consult and social worker to help with replacement. - Psych saw the patient, suggested decrease Paxil and start Celexa. Denied any need of inpatient psych.  2. Diabetes type 2 without complication-hold metformin. No hypoglycemia. Place on sliding scale insulin.  3. Acute kidney injury-secondary to dehydration. Hold hydrochlorothiazide, gently hydrated with IV fluids, follow BUN and creatinine and urine output. - Improved.  4. Diabetic neuropathy-hold gabapentin presently given her lethargy and altered mental status.  5. Restless leg syndrome-continue Requip.  6. Anxiety-continue Paxil, hold Klonopin given her mental status change.   Seen by psych, suggested to decrease Paxil and had Celexa.  DISCHARGE CONDITIONS:   Stable.  CONSULTS OBTAINED:  Treatment Team:  Darliss RidgelKapur, Aarti K, MD  DRUG ALLERGIES:   Allergies  Allergen Reactions  . Sulfa Antibiotics Rash    DISCHARGE MEDICATIONS:   Allergies as of 06/20/2017      Reactions   Sulfa Antibiotics Rash      Medication List    STOP taking these medications   clonazePAM 0.5 MG tablet Commonly known as:  KLONOPIN   diclofenac 75 MG EC tablet Commonly known as:  VOLTAREN   glipiZIDE 5 MG tablet Commonly known as:  GLUCOTROL   hydrochlorothiazide 12.5 MG tablet Commonly known as:  HYDRODIURIL   HYDROcodone-acetaminophen 5-325 MG tablet Commonly known as:  NORCO   hydrOXYzine 50 MG tablet Commonly known as:  ATARAX/VISTARIL   predniSONE 50 MG tablet Commonly known as:  DELTASONE     TAKE these medications   citalopram 20 MG tablet Commonly known as:  CELEXA Take 1 tablet (20 mg total) by mouth daily. Start taking on:  06/21/2017   gabapentin 100 MG capsule Commonly known as:  NEURONTIN Take 1 capsule (100 mg total) by mouth 2 (two) times daily. What changed:    medication strength  how much to take   metFORMIN 500 MG tablet Commonly known as:  GLUCOPHAGE Take 1 tablet by mouth 2 (two)  times daily with a meal.   PARoxetine 20 MG tablet Commonly known as:  PAXIL Take 1 tablet (20 mg total) by mouth daily. Start taking on:  06/21/2017 What changed:    medication strength  how much to take   rOPINIRole 1 MG tablet Commonly known as:  REQUIP Take 1 tablet by mouth 2 (two) times daily.        DISCHARGE INSTRUCTIONS:    Follow with PMD in 1 week.  If you experience worsening of your admission symptoms, develop shortness of breath, life threatening emergency, suicidal or homicidal thoughts you must seek medical attention immediately by calling 911 or calling your MD immediately  if symptoms less severe.  You Must read complete instructions/literature along with all the possible adverse reactions/side effects for all the Medicines you take and that have been prescribed to you. Take any new Medicines after you have completely understood and accept all the possible adverse reactions/side effects.   Please note  You were cared for by a hospitalist during your hospital stay. If you have any questions about your discharge medications or the care you received while you were in the hospital after you are discharged, you can call the unit and asked to speak with the hospitalist on call if the hospitalist that took care of you is not available. Once you are discharged, your primary care physician will handle any further medical issues. Please note that NO REFILLS for any discharge medications will be authorized once you are discharged, as it is imperative that you return to your primary care physician (or establish a relationship with a primary care physician if you do not have one) for your aftercare needs so that they can reassess your need for medications and monitor your lab values.    Today   CHIEF COMPLAINT:   Chief Complaint  Patient presents with  . Altered Mental Status  . Hyperglycemia    HISTORY OF PRESENT ILLNESS:  Christine Buchanan  is a 62 y.o. female with a  known history of diabetes, hypertension, hyperlipidemia, anxiety who presents to the hospital due to altered mental status/encephalopathy. Patient was in the emergency room yesterday complaining of back pain and diagnosed with sciatica but the ER physician and discharged on a prednisone taper. She was given a bus pass and discharged earlier this morning and was found down near the bus stop a few hours later and sent back to the ER for further evaluation. Patient has had an extensive workup for altered mental status including CT head, urinalysis, ammonia level, electrolytes, ABG all of which have been within normal range. Patient was also placed on BiPAP empirically given her mild hypercarbia, and also given some Narcan but her mental status still continues to be poor. Hospitalist services were contacted further treatment evaluation. Patient is arousable to a mild sternal rub but remains confused and answers some questions appropriately but is a very poor historian presently.   VITAL SIGNS:  Blood pressure (!) 145/84, pulse 74, temperature 98 F (36.7 C), temperature source Oral, resp. rate 17, height 5\' 4"  (1.626 m), weight 86.2 kg (190 lb 1.6 oz), SpO2 93 %.  I/O:    Intake/Output Summary (  Last 24 hours) at 06/20/2017 1351 Last data filed at 06/20/2017 1034 Gross per 24 hour  Intake 2388.33 ml  Output 500 ml  Net 1888.33 ml    PHYSICAL EXAMINATION:   GENERAL:  62 y.o.-year-old patient lying in the bed with no acute distress.  EYES: Pupils equal, round, reactive to light and accommodation. No scleral icterus. Extraocular muscles intact.  HEENT: Head atraumatic, normocephalic. Oropharynx and nasopharynx clear.  NECK:  Supple, no jugular venous distention. No thyroid enlargement, no tenderness.  LUNGS: Normal breath sounds bilaterally, no wheezing, rales,rhonchi or crepitation. No use of accessory muscles of respiration.  CARDIOVASCULAR: S1, S2 normal. No murmurs, rubs, or gallops.  ABDOMEN:  Soft, nontender, nondistended. Bowel sounds present. No organomegaly or mass.  EXTREMITIES: No pedal edema, cyanosis, or clubbing.  NEUROLOGIC: Cranial nerves II through XII are intact. Muscle strength 4-5/5 in all extremities. Sensation intact. Gait not checked.  PSYCHIATRIC: The patient is alert and oriented x 2.  SKIN: No obvious rash, lesion, or ulcer.     DATA REVIEW:   CBC Recent Labs  Lab 06/19/17 0802  WBC 8.1  HGB 12.9  HCT 38.2  PLT 227    Chemistries  Recent Labs  Lab 06/18/17 1051 06/19/17 0633  NA 136 138  K 3.9 3.9  CL 97* 104  CO2 28 28  GLUCOSE 366* 189*  BUN 24* 17  CREATININE 1.31* 1.06*  CALCIUM 9.7 8.8*  AST 26  --   ALT 15  --   ALKPHOS 88  --   BILITOT 0.5  --     Cardiac Enzymes No results for input(s): TROPONINI in the last 168 hours.  Microbiology Results  No results found for this or any previous visit.  RADIOLOGY:  Dg Abd 1 View  Result Date: 06/18/2017 CLINICAL DATA:  MRI clearance. EXAM: ABDOMEN - 1 VIEW COMPARISON:  None. FINDINGS: Cholecystectomy clips in the right upper quadrant. No other radiopaque foreign body or other metallic density in the abdomen. No findings to preclude MRI imaging. Normal bowel gas pattern with moderate colonic stool burden. IMPRESSION: No findings to preclude MRI imaging. Electronically Signed   By: Rubye Oaks M.D.   On: 06/18/2017 21:44   Mr Brain Wo Contrast  Result Date: 06/18/2017 CLINICAL DATA:  62 y/o  F; status post fall and confusion. EXAM: MRI HEAD WITHOUT CONTRAST TECHNIQUE: Multiplanar, multiecho pulse sequences of the brain and surrounding structures were obtained without intravenous contrast. COMPARISON:  06/18/2016 CT head. FINDINGS: Brain: No acute infarction, hemorrhage, hydrocephalus, extra-axial collection or mass lesion. Incidental right tentorium cerebelli subcentimeter lipoma without mass effect. Few punctate foci of T2 FLAIR hyperintense signal abnormality in frontal white matter  compatible with minimal chronic microvascular ischemic changes. Vascular: Normal flow voids. Skull and upper cervical spine: Normal marrow signal. Sinuses/Orbits: Right mastoid tip opacification. Bilateral maxillary sinus mucous retention cyst. Orbits are unremarkable. Other: None. IMPRESSION: 1. No acute intracranial abnormality. 2. Minimal chronic microvascular ischemic changes of white matter. Electronically Signed   By: Mitzi Hansen M.D.   On: 06/18/2017 22:41   Mr Lumbar Spine Wo Contrast  Result Date: 06/18/2017 CLINICAL DATA:  Back pain.  Cauda equina suspected. EXAM: MRI LUMBAR SPINE WITHOUT CONTRAST TECHNIQUE: Multiplanar, multisequence MR imaging of the lumbar spine was performed. No intravenous contrast was administered. COMPARISON:  None. FINDINGS: Segmentation:  Standard. Alignment:  Physiologic. Vertebrae:  No fracture, evidence of discitis, or bone lesion. Conus medullaris and cauda equina: Conus extends to the lower L2 level. Conus and  cauda equina appear normal. Paraspinal and other soft tissues: No acute finding. Right renal cystic intensities. Disc levels: T11-12: Small right paracentral disc protrusion T12- L1: Unremarkable. L1-L2: Noncompressive left foraminal protrusion. L2-L3: Mild disc bulging. L3-L4: Mild disc narrowing with circumferential disc bulging. Shallow left extraforaminal disc protrusion without compressive stenosis. L4-L5: Advanced degenerative facet arthropathy with posterior element hypertrophy. The disc is mildly narrowed and bulging. Moderate to advanced spinal stenosis, advanced appearing on axial slices and more moderate on sagittal acquisition. Patent foramina L5-S1:Advanced degenerative facet arthropathy. No herniation or impingement. IMPRESSION: 1. No acute finding. 2. L4-5 high-grade spinal stenosis mainly due to advanced degenerative facet arthropathy. 3. Noncompressive disc protrusions at T11-12, L1-2, and L3-4 as described. 4. L5-S1 advanced facet  arthropathy. Electronically Signed   By: Marnee Spring M.D.   On: 06/18/2017 22:46    EKG:   Orders placed or performed during the hospital encounter of 06/18/17  . ED EKG  . ED EKG  . EKG 12-Lead  . EKG 12-Lead      Management plans discussed with the patient, family and they are in agreement.  CODE STATUS:     Code Status Orders  (From admission, onward)        Start     Ordered   06/18/17 1929  Full code  Continuous     06/18/17 1928    Code Status History    Date Active Date Inactive Code Status Order ID Comments User Context   This patient has a current code status but no historical code status.      TOTAL TIME TAKING CARE OF THIS PATIENT: 35 minutes.    Altamese Dilling M.D on 06/20/2017 at 1:51 PM  Between 7am to 6pm - Pager - [phone]  After 6pm go to [email or web address] - password [email or web address]  [facility]  Office  [phone]  CC: Primary care physician; Hillery Aldo, MD   Note: This dictation was prepared with Dragon dictation along with smaller phrase technology. Any transcriptional errors that result from this process are unintentional.

## 2017-06-20 NOTE — Progress Notes (Signed)
Patient did not want to wait in the room until the taxi is called. She wanted to go to the ED waiting area, stating that there is an area where she can charge her cell phone. This RN offered to find a charger so that she could charge her cell phone in the room. The patient insisted on not waiting in the room. She was given the telephone number to the 2C unit and instructed to call this RN and the taxi would be called for her.

## 2017-06-21 LAB — HIV ANTIBODY (ROUTINE TESTING W REFLEX): HIV Screen 4th Generation wRfx: NONREACTIVE

## 2017-06-25 ENCOUNTER — Other Ambulatory Visit: Payer: Self-pay | Admitting: Student

## 2017-06-25 DIAGNOSIS — M71331 Other bursal cyst, right wrist: Secondary | ICD-10-CM

## 2017-06-26 LAB — BLOOD GAS, VENOUS
Acid-Base Excess: 6.6 mmol/L — ABNORMAL HIGH (ref 0.0–2.0)
Bicarbonate: 33.5 mmol/L — ABNORMAL HIGH (ref 20.0–28.0)
O2 SAT: 89.3 %
PCO2 VEN: 58 mmHg (ref 44.0–60.0)
PH VEN: 7.37 (ref 7.250–7.430)
Patient temperature: 37
pO2, Ven: 59 mmHg — ABNORMAL HIGH (ref 32.0–45.0)

## 2017-06-28 LAB — BENZODIAZEPINES,MS,WB/SP RFX
7-Aminoclonazepam: 31 ng/mL
ALPRAZOLAM: NEGATIVE ng/mL
BENZODIAZEPINES CONFIRM: POSITIVE
CHLORDIAZEPOXIDE: NEGATIVE ng/mL
CLONAZEPAM: 34 ng/mL
DESMETHYLCHLORDIAZEPOXIDE: NEGATIVE ng/mL
Desalkylflurazepam: NEGATIVE ng/mL
Desmethyldiazepam: NEGATIVE ng/mL
Diazepam: NEGATIVE ng/mL
FLURAZEPAM: NEGATIVE ng/mL
LORAZEPAM: NEGATIVE ng/mL
MIDAZOLAM: NEGATIVE ng/mL
Oxazepam: NEGATIVE ng/mL
Temazepam: NEGATIVE ng/mL
Triazolam: NEGATIVE ng/mL

## 2017-06-28 LAB — DRUG SCREEN 10 W/CONF, SERUM
AMPHETAMINES, IA: NEGATIVE ng/mL
Barbiturates, IA: NEGATIVE ug/mL
Benzodiazepines, IA: POSITIVE ng/mL
Cocaine & Metabolite, IA: NEGATIVE ng/mL
Methadone, IA: NEGATIVE ng/mL
OPIATES, IA: NEGATIVE ng/mL
OXYCODONES, IA: NEGATIVE ng/mL
Phencyclidine, IA: NEGATIVE ng/mL
Propoxyphene, IA: NEGATIVE ng/mL
THC(MARIJUANA) METABOLITE, IA: NEGATIVE ng/mL

## 2017-06-30 ENCOUNTER — Other Ambulatory Visit: Payer: Self-pay | Admitting: Student

## 2017-06-30 DIAGNOSIS — R200 Anesthesia of skin: Secondary | ICD-10-CM

## 2017-07-07 ENCOUNTER — Ambulatory Visit: Payer: Medicaid Other

## 2017-08-12 ENCOUNTER — Other Ambulatory Visit: Payer: Self-pay | Admitting: Student

## 2017-08-12 DIAGNOSIS — M25531 Pain in right wrist: Principal | ICD-10-CM

## 2017-08-12 DIAGNOSIS — G8929 Other chronic pain: Secondary | ICD-10-CM

## 2017-09-17 ENCOUNTER — Ambulatory Visit: Payer: Medicaid Other

## 2017-09-21 ENCOUNTER — Other Ambulatory Visit: Payer: Self-pay

## 2017-09-21 ENCOUNTER — Encounter: Payer: Self-pay | Admitting: Emergency Medicine

## 2017-09-21 DIAGNOSIS — I10 Essential (primary) hypertension: Secondary | ICD-10-CM | POA: Diagnosis not present

## 2017-09-21 DIAGNOSIS — Z79899 Other long term (current) drug therapy: Secondary | ICD-10-CM | POA: Diagnosis not present

## 2017-09-21 DIAGNOSIS — E119 Type 2 diabetes mellitus without complications: Secondary | ICD-10-CM | POA: Insufficient documentation

## 2017-09-21 DIAGNOSIS — Z7984 Long term (current) use of oral hypoglycemic drugs: Secondary | ICD-10-CM | POA: Diagnosis not present

## 2017-09-21 DIAGNOSIS — F17210 Nicotine dependence, cigarettes, uncomplicated: Secondary | ICD-10-CM | POA: Diagnosis not present

## 2017-09-21 DIAGNOSIS — M25531 Pain in right wrist: Secondary | ICD-10-CM | POA: Diagnosis present

## 2017-09-21 NOTE — ED Triage Notes (Addendum)
Patient ambulatory to triage with steady gait, without difficulty or distress noted; pt reports has cysts to her right wrist causing pain; st has ortho appt tomorrow for such; taking gabapentin without relief

## 2017-09-22 ENCOUNTER — Emergency Department
Admission: EM | Admit: 2017-09-22 | Discharge: 2017-09-22 | Disposition: A | Discharge status: Home / Self Care | Payer: Medicaid Other | Attending: Emergency Medicine | Admitting: Emergency Medicine

## 2017-09-22 DIAGNOSIS — M25531 Pain in right wrist: Secondary | ICD-10-CM

## 2017-09-22 MED ORDER — OXYCODONE-ACETAMINOPHEN 5-325 MG PO TABS
ORAL_TABLET | ORAL | Status: AC
  Filled 2017-09-22: qty 1

## 2017-09-22 MED ORDER — OXYCODONE-ACETAMINOPHEN 5-325 MG PO TABS: 1.0000 | ORAL_TABLET | Freq: Once | ORAL | Status: DC

## 2017-09-22 MED ORDER — KETOROLAC TROMETHAMINE 10 MG PO TABS
10.0000 mg | ORAL_TABLET | Freq: Once | ORAL | Status: AC
  Administered 2017-09-22: 10 mg via ORAL

## 2017-09-22 MED ORDER — KETOROLAC TROMETHAMINE 10 MG PO TABS
ORAL_TABLET | ORAL | Status: AC
  Filled 2017-09-22: qty 1

## 2017-09-22 NOTE — ED Provider Notes (Signed)
[facility] Emergency Department Provider Note   First MD Initiated Contact with Patient 09/22/17 0153     (approximate)  I have reviewed the triage vital signs and the nursing notes.   HISTORY  Chief Complaint Wrist Pain    HPI Christine Buchanan is a 62 y.o. female list of chronic medical conditions presents to the emergency department with history of cyst in the right wrist she is currently causing pain today.  Patient states current pain score is 8 out of 10 worse with any movement of the wrist.  Patient states that she has a appointment with her orthopedist today for MRI and plan regarding cyst in the right wrist.  Past Medical History:  Diagnosis Date  . Anxiety   . Diabetes mellitus without complication (HCC)   . Hyperlipidemia   . Hypertension     Patient Active Problem List   Diagnosis Date Noted  . Major depressive disorder, recurrent episode (HCC) 06/19/2017  . Alcohol dependence in remission (HCC) 06/19/2017  . Cocaine abuse, in remission (HCC) 06/19/2017  . Cannabis abuse, in remission 06/19/2017  . Panic disorder without agoraphobia 06/19/2017  . Altered mental status 06/18/2017    Past Surgical History:  Procedure Laterality Date  . CESAREAN SECTION    . CHOLECYSTECTOMY      Prior to Admission medications   Medication Sig Start Date End Date Taking? Authorizing Provider  citalopram (CELEXA) 20 MG tablet Take 1 tablet (20 mg total) by mouth daily. 06/21/17   Altamese DillingVachhani, Vaibhavkumar, MD  gabapentin (NEURONTIN) 100 MG capsule Take 1 capsule (100 mg total) by mouth 2 (two) times daily. 06/20/17   Altamese DillingVachhani, Vaibhavkumar, MD  metFORMIN (GLUCOPHAGE) 500 MG tablet Take 1 tablet by mouth 2 (two) times daily with a meal.  03/13/17   [provider]  PARoxetine (PAXIL) 20 MG tablet Take 1 tablet (20 mg total) by mouth daily. 06/21/17   Altamese DillingVachhani, Vaibhavkumar, MD  rOPINIRole (REQUIP) 1 MG tablet Take 1 tablet by mouth 2 (two) times daily.  03/13/17   [provider]    Allergies Sulfa antibiotics  No family history on file.  Social History Social History   Tobacco Use  . Smoking status: Current Every Day Smoker    Packs/day: 0.50    Years: 40.00    Pack years: 20.00    Types: Cigarettes  . Smokeless tobacco: Never Used  Substance Use Topics  . Alcohol use: Yes    Comment: occasionly  . Drug use: No    Review of Systems Constitutional: No fever/chills Eyes: No visual changes. ENT: No sore throat. Cardiovascular: Denies chest pain. Respiratory: Denies shortness of breath. Gastrointestinal: No abdominal pain.  No nausea, no vomiting.  No diarrhea.  No constipation. Genitourinary: Negative for dysuria. Musculoskeletal: Negative for neck pain.  Negative for back pain.  Positive for  right wrist pain Integumentary: Negative for rash. Neurological: Negative for headaches, focal weakness or numbness.   ____________________________________________   PHYSICAL EXAM:  VITAL SIGNS: ED Triage Vitals  Enc Vitals Group     BP 09/21/17 2225 (!) 156/78     Pulse Rate 09/21/17 2225 90     Resp 09/21/17 2225 20     Temp 09/21/17 2225 98.3 F (36.8 C)     Temp Source 09/21/17 2225 Oral     SpO2 09/21/17 2225 97 %     Weight 09/21/17 2224 83.9 kg (185 lb)     Height 09/21/17 2224 1.626 m (5\' 4" )  Head Circumference --      Peak Flow --      Pain Score 09/21/17 2224 10     Pain Loc --      Pain Edu? --      Excl. in GC? --     Constitutional: Alert and oriented. Well appearing and in no acute distress  Neck: No stridor.   Cardiovascular: Normal rate, regular rhythm. Good peripheral circulation. Grossly normal heart sounds. Respiratory: Normal respiratory effort.  No retractions. Lungs CTAB. Gastrointestinal: Soft and nontender. No distention.  Musculoskeletal: Pain with palpation of the ulnar aspect of the left wrist no gross deformity noted.  Pain with active and passive range of  motion Neurologic:  Normal speech and language. No gross focal neurologic deficits are appreciated.  Skin:  Skin is warm, dry and intact. No rash noted. Psychiatric: Mood and affect are normal. Speech and behavior are normal.    Procedures   ____________________________________________   INITIAL IMPRESSION / ASSESSMENT AND PLAN / ED COURSE  As part of my medical decision making, I reviewed the following data within the electronic MEDICAL RECORD NUMBER   Patient given p.o. Toradol in the emergency department recommendation to follow-up with orthopedist today ____________________________________________  FINAL CLINICAL IMPRESSION(S) / ED DIAGNOSES  Final diagnoses:  Right wrist pain     MEDICATIONS GIVEN DURING THIS VISIT:  Medications - No data to display   ED Discharge Orders    None       Note:  This document was prepared using Dragon voice recognition software and may include unintentional dictation errors.    Darci Current, MD 09/22/17 [phone]

## 2017-10-27 ENCOUNTER — Emergency Department: Payer: Medicaid Other

## 2017-10-27 ENCOUNTER — Emergency Department
Admission: EM | Admit: 2017-10-27 | Discharge: 2017-10-27 | Disposition: A | Discharge status: Home / Self Care | Payer: Medicaid Other | Attending: Student in an Organized Health Care Education/Training Program | Admitting: Student in an Organized Health Care Education/Training Program

## 2017-10-27 DIAGNOSIS — Z79899 Other long term (current) drug therapy: Secondary | ICD-10-CM | POA: Diagnosis not present

## 2017-10-27 DIAGNOSIS — E119 Type 2 diabetes mellitus without complications: Secondary | ICD-10-CM | POA: Insufficient documentation

## 2017-10-27 DIAGNOSIS — F17210 Nicotine dependence, cigarettes, uncomplicated: Secondary | ICD-10-CM | POA: Diagnosis not present

## 2017-10-27 DIAGNOSIS — I10 Essential (primary) hypertension: Secondary | ICD-10-CM | POA: Diagnosis not present

## 2017-10-27 DIAGNOSIS — M25531 Pain in right wrist: Secondary | ICD-10-CM | POA: Diagnosis not present

## 2017-10-27 DIAGNOSIS — M25551 Pain in right hip: Secondary | ICD-10-CM | POA: Diagnosis not present

## 2017-10-27 DIAGNOSIS — Z7984 Long term (current) use of oral hypoglycemic drugs: Secondary | ICD-10-CM | POA: Insufficient documentation

## 2017-10-27 DIAGNOSIS — W19XXXA Unspecified fall, initial encounter: Secondary | ICD-10-CM

## 2017-10-27 MED ORDER — TRAMADOL HCL 50 MG PO TABS: 50.0000 mg | ORAL_TABLET | Freq: Four times a day (QID) | ORAL | 0 refills | Status: AC | PRN

## 2017-10-27 NOTE — ED Provider Notes (Signed)
[facility] Emergency Department Provider Note  ____________________________________________  Time seen: Approximately 10:46 PM  I have reviewed the triage vital signs and the nursing notes.   HISTORY  Chief Complaint Fall    HPI Christine Buchanan is a 62 y.o. female presents to the emergency department with right hip pain and right knee pain after patient tripped over a backpack in her kitchen earlier today.  Patient did not hit her head or lose consciousness.  She has been ambulating without difficulty.  Patient currently rates her pain 8 out of 10 in intensity and describes it as sore.  No alleviating medications have been attempted.  No weakness, radiculopathy or changes in sensation in the upper or lower extremities.  Past Medical History:  Diagnosis Date  . Anxiety   . Diabetes mellitus without complication (HCC)   . Hyperlipidemia   . Hypertension     Patient Active Problem List   Diagnosis Date Noted  . Major depressive disorder, recurrent episode (HCC) 06/19/2017  . Alcohol dependence in remission (HCC) 06/19/2017  . Cocaine abuse, in remission (HCC) 06/19/2017  . Cannabis abuse, in remission 06/19/2017  . Panic disorder without agoraphobia 06/19/2017  . Altered mental status 06/18/2017    Past Surgical History:  Procedure Laterality Date  . CESAREAN SECTION    . CHOLECYSTECTOMY      Prior to Admission medications   Medication Sig Start Date End Date Taking? Authorizing Provider  citalopram (CELEXA) 20 MG tablet Take 1 tablet (20 mg total) by mouth daily. 06/21/17   Altamese Dilling, MD  gabapentin (NEURONTIN) 100 MG capsule Take 1 capsule (100 mg total) by mouth 2 (two) times daily. 06/20/17   Altamese Dilling, MD  metFORMIN (GLUCOPHAGE) 500 MG tablet Take 1 tablet by mouth 2 (two) times daily with a meal.  03/13/17   [provider]  PARoxetine (PAXIL) 20 MG tablet Take 1 tablet (20 mg total) by mouth daily. 06/21/17    Altamese Dilling, MD  rOPINIRole (REQUIP) 1 MG tablet Take 1 tablet by mouth 2 (two) times daily. 03/13/17   [provider]  traMADol (ULTRAM) 50 MG tablet Take 1 tablet (50 mg total) by mouth every 6 (six) hours as needed for up to 3 days. 10/27/17 10/30/17  Orvil Feil, PA-C    Allergies Sulfa antibiotics  No family history on file.  Social History Social History   Tobacco Use  . Smoking status: Current Every Day Smoker    Packs/day: 0.50    Years: 40.00    Pack years: 20.00    Types: Cigarettes  . Smokeless tobacco: Never Used  Substance Use Topics  . Alcohol use: Yes    Comment: occasionly  . Drug use: No     Review of Systems  Constitutional: No fever/chills Eyes: No visual changes. No discharge ENT: No upper respiratory complaints. Cardiovascular: no chest pain. Respiratory: no cough. No SOB. Gastrointestinal: No abdominal pain.  No nausea, no vomiting.  No diarrhea.  No constipation. Genitourinary: Negative for dysuria. No hematuria Musculoskeletal: Right hip pain and right knee pain.  Skin: Negative for rash, abrasions, lacerations, ecchymosis. Neurological: Negative for headaches, focal weakness or numbness.   ____________________________________________   PHYSICAL EXAM:  VITAL SIGNS: ED Triage Vitals  Enc Vitals Group     BP 10/27/17 2156 120/85     Pulse Rate 10/27/17 2156 95     Resp 10/27/17 2156 15     Temp 10/27/17 2156 98 F (36.7 C)  Temp Source 10/27/17 2156 Oral     SpO2 10/27/17 2156 95 %     Weight 10/27/17 2157 185 lb (83.9 kg)     Height --      Head Circumference --      Peak Flow --      Pain Score 10/27/17 2156 7     Pain Loc --      Pain Edu? --      Excl. in GC? --      Constitutional: Alert and oriented. Well appearing and in no acute distress. Eyes: Conjunctivae are normal. PERRL. EOMI. Head: Atraumatic. ENT:      Ears: TMs are pearly.      Nose: No congestion/rhinnorhea.      Mouth/Throat:  Mucous membranes are moist.  Neck: No stridor.  No cervical spine tenderness to palpation.  Cardiovascular: Normal rate, regular rhythm. Normal S1 and S2.  Good peripheral circulation. Respiratory: Normal respiratory effort without tachypnea or retractions. Lungs CTAB. Good air entry to the bases with no decreased or absent breath sounds. Musculoskeletal: Patient has no pain with internal and external rotation of the right hip.  Right knee: Patient has tenderness elicited with palpation over the lateral joint line.  Negative anterior and posterior drawer test.  No laxity with MCL or LCL testing.  Negative ballottement.  Palpable dorsalis pedis pulse, right. Neurologic:  Normal speech and language. No gross focal neurologic deficits are appreciated.  Skin:  Skin is warm, dry and intact. No rash noted.   ____________________________________________   LABS (all labs ordered are listed, but only abnormal results are displayed)  Labs Reviewed - No data to display ____________________________________________  EKG   ____________________________________________  RADIOLOGY Geraldo Pitter, personally viewed and evaluated these images (plain radiographs) as part of my medical decision making, as well as reviewing the written report by the radiologist.  Dg Wrist Complete Right  Result Date: 10/27/2017 CLINICAL DATA:  Fall with wrist pain EXAM: RIGHT WRIST - COMPLETE 3+ VIEW COMPARISON:  03/15/2015 FINDINGS: No acute displaced fracture or malalignment. Joint spaces are within normal limits. IMPRESSION: No acute osseous abnormality Electronically Signed   By: Jasmine Pang M.D.   On: 10/27/2017 22:48   Dg Knee Complete 4 Views Right  Result Date: 10/27/2017 CLINICAL DATA:  Fall EXAM: RIGHT KNEE - COMPLETE 4+ VIEW COMPARISON:  01/25/2015 FINDINGS: Small cortex irregularity lateral articular surface of the tibia. Mild to moderate arthritis medial compartment. No large knee effusion. IMPRESSION:  Cortical irregularity at the lateral articular surface of the proximal tibia, possible acute nondisplaced fracture. Electronically Signed   By: Jasmine Pang M.D.   On: 10/27/2017 22:47   Dg Hip Unilat  With Pelvis 2-3 Views Right  Result Date: 10/27/2017 CLINICAL DATA:  Patient reports mechanical fall today. Patient c/o right knee pain, right wrist pain, right hip pain, and lower back pain. EXAM: DG HIP (WITH OR WITHOUT PELVIS) 2-3V RIGHT COMPARISON:  None. FINDINGS: Single view of the pelvis and two views of the RIGHT hip are provided. Osseous alignment is normal. No fracture line or displaced fracture fragment seen. Soft tissues about the pelvis and RIGHT hip are unremarkable. IMPRESSION: Negative. Electronically Signed   By: Bary Richard M.D.   On: 10/27/2017 22:42    ____________________________________________    PROCEDURES  Procedure(s) performed:    Procedures    Medications - No data to display   ____________________________________________   INITIAL IMPRESSION / ASSESSMENT AND PLAN / ED COURSE  Pertinent labs &  imaging results that were available during my care of the patient were reviewed by me and considered in my medical decision making (see chart for details).  Review of the [place] CSRS was performed in accordance of the [facility] prior to dispensing any controlled drugs.    Assessment and plan:  Fall:  Patient presents to the emergency department after a fall earlier today.  Patient was reporting right knee pain and low back pain.  X-ray examination was concerning for an avulsion type fracture along the lateral surface of the proximal tibia.  Patient was placed in a knee immobilizer and she was referred to orthopedics.  Other x-ray examination conducted in the emergency department was reassuring.  Patient was discharged with tramadol for pain.  All patient questions were answered.    ____________________________________________  FINAL CLINICAL IMPRESSION(S) / ED  DIAGNOSES  Final diagnoses:  Fall, initial encounter      NEW MEDICATIONS STARTED DURING THIS VISIT:  ED Discharge Orders        Ordered    traMADol (ULTRAM) 50 MG tablet  Every 6 hours PRN     10/27/17 2307          This chart was dictated using voice recognition software/Dragon. Despite best efforts to proofread, errors can occur which can change the meaning. Any change was purely unintentional.    Orvil Feil, PA-C 10/28/17 Ailene Rud, MD 10/28/17 1500

## 2017-10-27 NOTE — ED Triage Notes (Signed)
Patient has a large amount of luggage with her.

## 2017-10-27 NOTE — ED Triage Notes (Addendum)
Patient reports mechanical fall today. Patient c/o right knee pain, right wrist pain, right hip pain, and lower back pain. Patient ambulated back from water fountain, across lobby to wheelchair when this RN went to bring patient to triage. Patient reports pain is most severe in right knee.

## 2017-10-27 NOTE — ED Notes (Signed)
Patient has luggage at front desk in lobby.

## 2017-10-27 NOTE — ED Notes (Signed)
Pt reports falling and multiple c/o pain throughout body.  Pt up and ambulatory in room.  Pt is A&Ox4, in NAD.

## 2017-11-02 ENCOUNTER — Other Ambulatory Visit: Payer: Self-pay | Admitting: Orthopedic Surgery

## 2017-11-02 ENCOUNTER — Ambulatory Visit
Admission: RE | Admit: 2017-11-02 | Discharge: 2017-11-02 | Disposition: A | Discharge status: Home / Self Care | Payer: Medicaid Other | Source: Ambulatory Visit | Attending: Orthopedic Surgery | Admitting: Orthopedic Surgery

## 2017-11-02 DIAGNOSIS — M1711 Unilateral primary osteoarthritis, right knee: Secondary | ICD-10-CM | POA: Insufficient documentation

## 2017-11-02 DIAGNOSIS — S82141A Displaced bicondylar fracture of right tibia, initial encounter for closed fracture: Secondary | ICD-10-CM

## 2017-11-03 ENCOUNTER — Encounter
Admission: RE | Disposition: A | Discharge status: Home / Self Care | Payer: Self-pay | Source: Ambulatory Visit | Attending: Surgery

## 2017-11-03 ENCOUNTER — Ambulatory Visit
Admission: RE | Admit: 2017-11-03 | Discharge: 2017-11-03 | Disposition: A | Discharge status: Home / Self Care | Payer: Medicaid Other | Source: Ambulatory Visit | Attending: Surgery | Admitting: Surgery

## 2017-11-03 ENCOUNTER — Ambulatory Visit: Payer: Medicaid Other | Admitting: Anesthesiology

## 2017-11-03 DIAGNOSIS — G5601 Carpal tunnel syndrome, right upper limb: Secondary | ICD-10-CM | POA: Insufficient documentation

## 2017-11-03 DIAGNOSIS — F17200 Nicotine dependence, unspecified, uncomplicated: Secondary | ICD-10-CM | POA: Insufficient documentation

## 2017-11-03 DIAGNOSIS — Z79899 Other long term (current) drug therapy: Secondary | ICD-10-CM | POA: Diagnosis not present

## 2017-11-03 DIAGNOSIS — K219 Gastro-esophageal reflux disease without esophagitis: Secondary | ICD-10-CM | POA: Insufficient documentation

## 2017-11-03 DIAGNOSIS — F329 Major depressive disorder, single episode, unspecified: Secondary | ICD-10-CM | POA: Insufficient documentation

## 2017-11-03 DIAGNOSIS — Z8249 Family history of ischemic heart disease and other diseases of the circulatory system: Secondary | ICD-10-CM | POA: Insufficient documentation

## 2017-11-03 DIAGNOSIS — M67431 Ganglion, right wrist: Secondary | ICD-10-CM | POA: Diagnosis not present

## 2017-11-03 DIAGNOSIS — F419 Anxiety disorder, unspecified: Secondary | ICD-10-CM | POA: Diagnosis not present

## 2017-11-03 DIAGNOSIS — M1990 Unspecified osteoarthritis, unspecified site: Secondary | ICD-10-CM | POA: Insufficient documentation

## 2017-11-03 DIAGNOSIS — Z794 Long term (current) use of insulin: Secondary | ICD-10-CM | POA: Insufficient documentation

## 2017-11-03 DIAGNOSIS — E1140 Type 2 diabetes mellitus with diabetic neuropathy, unspecified: Secondary | ICD-10-CM | POA: Diagnosis not present

## 2017-11-03 DIAGNOSIS — G2581 Restless legs syndrome: Secondary | ICD-10-CM | POA: Insufficient documentation

## 2017-11-03 DIAGNOSIS — Z882 Allergy status to sulfonamides status: Secondary | ICD-10-CM | POA: Diagnosis not present

## 2017-11-03 DIAGNOSIS — Z9049 Acquired absence of other specified parts of digestive tract: Secondary | ICD-10-CM | POA: Diagnosis not present

## 2017-11-03 DIAGNOSIS — I10 Essential (primary) hypertension: Secondary | ICD-10-CM | POA: Diagnosis not present

## 2017-11-03 HISTORY — PX: GANGLION CYST EXCISION: SHX1691

## 2017-11-03 HISTORY — DX: Gastro-esophageal reflux disease without esophagitis: K21.9

## 2017-11-03 HISTORY — DX: Myoneural disorder, unspecified: G70.9

## 2017-11-03 HISTORY — DX: Other seasonal allergic rhinitis: J30.2

## 2017-11-03 HISTORY — DX: Unspecified osteoarthritis, unspecified site: M19.90

## 2017-11-03 HISTORY — PX: CARPAL TUNNEL RELEASE: SHX101

## 2017-11-03 LAB — GLUCOSE, CAPILLARY
Glucose-Capillary: 338 mg/dL — ABNORMAL HIGH (ref 65–99)
Glucose-Capillary: 408 mg/dL — ABNORMAL HIGH (ref 65–99)
Glucose-Capillary: 276 mg/dL — ABNORMAL HIGH (ref 65–99)

## 2017-11-03 SURGERY — EXCISION, GANGLION CYST, WRIST
Anesthesia: Regional | Laterality: Right | Wound class: "Clean "

## 2017-11-03 MED ORDER — FENTANYL CITRATE (PF) 100 MCG/2ML IJ SOLN
INTRAMUSCULAR | Status: DC | PRN
  Administered 2017-11-03 (×2): 25 ug via INTRAVENOUS
  Administered 2017-11-03: 50 ug via INTRAVENOUS

## 2017-11-03 MED ORDER — INSULIN REGULAR HUMAN 100 UNIT/ML IJ SOLN
10.0000 [IU] | Freq: Once | INTRAMUSCULAR | Status: AC
  Administered 2017-11-03: 10 [IU] via INTRAVENOUS

## 2017-11-03 MED ORDER — PROPOFOL 500 MG/50ML IV EMUL
INTRAVENOUS | Status: DC | PRN
  Administered 2017-11-03: 100 ug/kg/min via INTRAVENOUS

## 2017-11-03 MED ORDER — PROPOFOL 10 MG/ML IV BOLUS
INTRAVENOUS | Status: DC | PRN
  Administered 2017-11-03: 30 mg via INTRAVENOUS
  Administered 2017-11-03: 20 mg via INTRAVENOUS
  Administered 2017-11-03: 30 mg via INTRAVENOUS
  Administered 2017-11-03: 20 mg via INTRAVENOUS

## 2017-11-03 MED ORDER — LIDOCAINE HCL (PF) 0.5 % IJ SOLN
INTRAMUSCULAR | Status: DC | PRN
  Administered 2017-11-03: 30 mg via INTRAVENOUS

## 2017-11-03 MED ORDER — ONDANSETRON HCL 4 MG/2ML IJ SOLN
INTRAMUSCULAR | Status: DC | PRN
  Administered 2017-11-03: 4 mg via INTRAVENOUS

## 2017-11-03 MED ORDER — OXYCODONE HCL 5 MG PO TABS: 5.0000 mg | ORAL_TABLET | Freq: Once | ORAL | Status: DC | PRN

## 2017-11-03 MED ORDER — LACTATED RINGERS IV SOLN
INTRAVENOUS | Status: DC
  Administered 2017-11-03: 12:00:00 via INTRAVENOUS

## 2017-11-03 MED ORDER — FENTANYL CITRATE (PF) 100 MCG/2ML IJ SOLN: 25.0000 ug | INTRAMUSCULAR | Status: DC | PRN

## 2017-11-03 MED ORDER — DEXMEDETOMIDINE HCL 200 MCG/2ML IV SOLN
INTRAVENOUS | Status: DC | PRN
  Administered 2017-11-03: 8 ug via INTRAVENOUS
  Administered 2017-11-03: 4 ug via INTRAVENOUS

## 2017-11-03 MED ORDER — OXYCODONE HCL 5 MG/5ML PO SOLN: 5.0000 mg | Freq: Once | ORAL | Status: DC | PRN

## 2017-11-03 MED ORDER — ACETAMINOPHEN 10 MG/ML IV SOLN: 1000.0000 mg | Freq: Once | INTRAVENOUS | Status: DC | PRN

## 2017-11-03 MED ORDER — HYDROCODONE-ACETAMINOPHEN 5-325 MG PO TABS: 1.0000 | ORAL_TABLET | Freq: Four times a day (QID) | ORAL | 0 refills | Status: AC | PRN

## 2017-11-03 MED ORDER — LIDOCAINE HCL (CARDIAC) PF 100 MG/5ML IV SOSY
PREFILLED_SYRINGE | INTRAVENOUS | Status: DC | PRN
  Administered 2017-11-03: 40 mg via INTRAVENOUS

## 2017-11-03 MED ORDER — ONDANSETRON HCL 4 MG/2ML IJ SOLN: 4.0000 mg | Freq: Once | INTRAMUSCULAR | Status: DC | PRN

## 2017-11-03 MED ORDER — CEFAZOLIN SODIUM-DEXTROSE 2-4 GM/100ML-% IV SOLN
2.0000 g | Freq: Once | INTRAVENOUS | Status: AC
  Administered 2017-11-03: 2 g via INTRAVENOUS

## 2017-11-03 MED ORDER — LACTATED RINGERS IV SOLN: INTRAVENOUS | Status: DC

## 2017-11-03 MED ORDER — MIDAZOLAM HCL 5 MG/5ML IJ SOLN
INTRAMUSCULAR | Status: DC | PRN
  Administered 2017-11-03 (×2): 1 mg via INTRAVENOUS

## 2017-11-03 MED ORDER — BUPIVACAINE HCL (PF) 0.5 % IJ SOLN
INTRAMUSCULAR | Status: DC | PRN
  Administered 2017-11-03: 15 mL

## 2017-11-03 SURGICAL SUPPLY — 26 items
BANDAGE ELASTIC 2 LF NS (GAUZE/BANDAGES/DRESSINGS) ×3 IMPLANT
BANDAGE ELASTIC 3 LF NS (GAUZE/BANDAGES/DRESSINGS) IMPLANT
BNDG COHESIVE 4X5 TAN STRL (GAUZE/BANDAGES/DRESSINGS) ×3 IMPLANT
BNDG ESMARK 4X12 TAN STRL LF (GAUZE/BANDAGES/DRESSINGS) ×3 IMPLANT
CHLORAPREP W/TINT 26ML (MISCELLANEOUS) ×3 IMPLANT
CORD BIP STRL DISP 12FT (MISCELLANEOUS) ×3 IMPLANT
COVER LIGHT HANDLE UNIVERSAL (MISCELLANEOUS) ×6 IMPLANT
CUFF TOURNIQUET DUAL PORT 18X3 (MISCELLANEOUS) ×3 IMPLANT
DRAPE SURG 17X11 SM STRL (DRAPES) ×3 IMPLANT
GAUZE PETRO XEROFOAM 1X8 (MISCELLANEOUS) ×3 IMPLANT
GAUZE SPONGE 4X4 12PLY STRL (GAUZE/BANDAGES/DRESSINGS) ×3 IMPLANT
GLOVE BIO SURGEON STRL SZ8 (GLOVE) ×6 IMPLANT
GLOVE INDICATOR 8.0 STRL GRN (GLOVE) ×3 IMPLANT
GOWN STRL REUS W/ TWL LRG LVL3 (GOWN DISPOSABLE) ×1 IMPLANT
GOWN STRL REUS W/ TWL XL LVL3 (GOWN DISPOSABLE) ×1 IMPLANT
GOWN STRL REUS W/TWL LRG LVL3 (GOWN DISPOSABLE) ×2
GOWN STRL REUS W/TWL XL LVL3 (GOWN DISPOSABLE) ×2
KIT CARPAL TUNNEL (MISCELLANEOUS) ×2
KIT ESCP INSRT D SLOT CANN KN (MISCELLANEOUS) ×1 IMPLANT
KIT TURNOVER KIT A (KITS) ×3 IMPLANT
NS IRRIG 500ML POUR BTL (IV SOLUTION) ×3 IMPLANT
PACK EXTREMITY ARMC (MISCELLANEOUS) ×3 IMPLANT
SPLINT WRIST M RT TX990303 (SOFTGOODS) ×2 IMPLANT
STOCKINETTE IMPERVIOUS 9X36 MD (GAUZE/BANDAGES/DRESSINGS) ×3 IMPLANT
STRAP BODY AND KNEE 60X3 (MISCELLANEOUS) ×3 IMPLANT
SUT PROLENE 4 0 PS 2 18 (SUTURE) ×3 IMPLANT

## 2017-11-03 NOTE — Anesthesia Preprocedure Evaluation (Signed)
Anesthesia Evaluation  Patient identified by MRN, date of birth, ID band Patient awake    Reviewed: Allergy & Precautions, NPO status , Patient's Chart, lab work & pertinent test results  History of Anesthesia Complications (+) history of anesthetic complications (difficult IV stick)  Airway Mallampati: III  TM Distance: >3 FB Neck ROM: Full    Dental  (+)    Pulmonary Current Smoker (1/2 ppd),    Pulmonary exam normal breath sounds clear to auscultation       Cardiovascular Exercise Tolerance: Good hypertension, Normal cardiovascular exam Rhythm:Regular Rate:Normal     Neuro/Psych PSYCHIATRIC DISORDERS Anxiety Depression  Neuromuscular disease (foot neuropathy)    GI/Hepatic GERD  ,  Endo/Other  diabetes, Poorly Controlled, Type 2  Renal/GU negative Renal ROS     Musculoskeletal  (+) Arthritis , Osteoarthritis,    Abdominal   Peds  Hematology negative hematology ROS (+)   Anesthesia Other Findings Homelessness   Reproductive/Obstetrics                             Anesthesia Physical Anesthesia Plan  ASA: III  Anesthesia Plan: General and Bier Block and Bier Block-LIDOCAINE ONLY   Post-op Pain Management:  Regional for Post-op pain and GA combined w/ Regional for post-op pain   Induction: Intravenous  PONV Risk Score and Plan: 2 and Propofol infusion and TIVA  Airway Management Planned: Natural Airway  Additional Equipment:   Intra-op Plan:   Post-operative Plan:   Informed Consent: I have reviewed the patients History and Physical, chart, labs and discussed the procedure including the risks, benefits and alternatives for the proposed anesthesia with the patient or authorized representative who has indicated his/her understanding and acceptance.     Plan Discussed with: CRNA  Anesthesia Plan Comments:         Anesthesia Quick Evaluation

## 2017-11-03 NOTE — Anesthesia Postprocedure Evaluation (Signed)
Anesthesia Post Note  Patient: Christine Buchanan  Procedure(s) Performed: REMOVAL GANGLION OF WRIST (Right ) CARPAL TUNNEL RELEASE ENDOSCOPIC (Right )  Patient location during evaluation: PACU Anesthesia Type: Bier Block Level of consciousness: awake and alert, oriented and patient cooperative Pain management: pain level controlled Vital Signs Assessment: post-procedure vital signs reviewed and stable Respiratory status: spontaneous breathing, nonlabored ventilation and respiratory function stable Cardiovascular status: blood pressure returned to baseline and stable Postop Assessment: adequate PO intake Anesthetic complications: no    Reed Breech

## 2017-11-03 NOTE — Anesthesia Procedure Notes (Signed)
Anesthesia Regional Block: Bier block (IV Regional)   Pre-Anesthetic Checklist: ,, timeout performed, Correct Patient, Correct Site, Correct Laterality, Correct Procedure, Correct Position, site marked, Risks and benefits discussed,  Surgical consent,  Pre-op evaluation,  At surgeon's request and post-op pain management  Laterality: Right  Prep: alcohol swabs       Needles:  Injection technique: Single-shot  Needle Type: Other   (22g IV)        Additional Needles:   (22g IV)  Narrative:  Start time: 11/03/2017 12:48 PM End time: 11/03/2017 12:50 PM Injection made incrementally with aspirations every 5 mL.  Performed by: Personally  Anesthesiologist: Reed Breech, MD

## 2017-11-03 NOTE — H&P (Signed)
Paper H&P to be scanned into permanent record. H&P reviewed and patient re-examined. No changes. 

## 2017-11-03 NOTE — Anesthesia Procedure Notes (Signed)
Procedure Name: MAC Date/Time: 11/03/2017 12:37 PM Performed by: Janna Arch, CRNA Pre-anesthesia Checklist: Patient identified, Emergency Drugs available, Suction available, Patient being monitored and Timeout performed Patient Re-evaluated:Patient Re-evaluated prior to induction Oxygen Delivery Method: Simple face mask

## 2017-11-03 NOTE — Discharge Instructions (Addendum)
General Anesthesia, Adult, Care After These instructions provide you with information about caring for yourself after your procedure. Your health care provider may also give you more specific instructions. Your treatment has been planned according to current medical practices, but problems sometimes occur. Call your health care provider if you have any problems or questions after your procedure. What can I expect after the procedure? After the procedure, it is common to have:  Vomiting.  A sore throat.  Mental slowness.  It is common to feel:  Nauseous.  Cold or shivery.  Sleepy.  Tired.  Sore or achy, even in parts of your body where you did not have surgery.  Follow these instructions at home: For at least 24 hours after the procedure:  Do not: ? Participate in activities where you could fall or become injured. ? Drive. ? Use heavy machinery. ? Drink alcohol. ? Take sleeping pills or medicines that cause drowsiness. ? Make important decisions or sign legal documents. ? Take care of children on your own.  Rest. Eating and drinking  If you vomit, drink water, juice, or soup when you can drink without vomiting.  Drink enough fluid to keep your urine clear or pale yellow.  Make sure you have little or no nausea before eating solid foods.  Follow the diet recommended by your health care provider. General instructions  Have a responsible adult stay with you until you are awake and alert.  Return to your normal activities as told by your health care provider. Ask your health care provider what activities are safe for you.  Take over-the-counter and prescription medicines only as told by your health care provider.  If you smoke, do not smoke without supervision.  Keep all follow-up visits as told by your health care provider. This is important. Contact a health care provider if:  You continue to have nausea or vomiting at home, and medicines are not helpful.  You  cannot drink fluids or start eating again.  You cannot urinate after 8-12 hours.  You develop a skin rash.  You have fever.  You have increasing redness at the site of your procedure. Get help right away if:  You have difficulty breathing.  You have chest pain.  You have unexpected bleeding.  You feel that you are having a life-threatening or urgent problem. This information is not intended to replace advice given to you by your health care provider. Make sure you discuss any questions you have with your health care provider. Document Released: 09/07/2000 Document Revised: 11/04/2015 Document Reviewed: 05/16/2015 Elsevier Interactive Patient Education  2018 [employer].   Orthopedic discharge instructions: Keep dressing dry and intact. Keep hand elevated above heart level. May shower after dressing removed on postop day 4 (Sunday). Cover sutures with Band-Aids after drying off. Apply ice to affected area frequently. Take ES Tylenol or pain medication as prescribed when needed.  Return for follow-up in 10-14 days or as scheduled.

## 2017-11-03 NOTE — Transfer of Care (Signed)
Immediate Anesthesia Transfer of Care Note  Patient: Christine Buchanan  Procedure(s) Performed: REMOVAL GANGLION OF WRIST (Right ) CARPAL TUNNEL RELEASE ENDOSCOPIC (Right )  Patient Location: PACU  Anesthesia Type: General, Bier Block  Level of Consciousness: awake, alert  and patient cooperative  Airway and Oxygen Therapy: Patient Spontanous Breathing and Patient connected to supplemental oxygen  Post-op Assessment: Post-op Vital signs reviewed, Patient's Cardiovascular Status Stable, Respiratory Function Stable, Patent Airway and No signs of Nausea or vomiting  Post-op Vital Signs: Reviewed and stable  Complications: No apparent anesthesia complications

## 2017-11-03 NOTE — Op Note (Signed)
11/03/2017  1:28 PM  Patient:   Christine Buchanan  Pre-Op Diagnosis:   1.  Right carpal tunnel syndrome.  2. Ganglion cyst volo-ulnar right wrist.  Post-Op Diagnosis:   Same.  Procedure:   1. Endoscopic right carpal tunnel release.  2. Excision of ganglion cyst right wrist.  Surgeon:   Maryagnes Amos, MD  Anesthesia:   Bier block  Findings:   As above.  Complications:   None  EBL:   2 cc  Fluids:   350 cc crystalloid  TT:   36 minutes at 250 mmHg  Drains:   None  Closure:   4-0 Prolene interrupted sutures  Brief Clinical Note:   The patient is a 62 year old female with a history of gradually worsening right hand and wrist pain and paresthesias.  The symptoms have progressed despite medications, activity modification, splinting, etc.  Her history and examination are consistent with carpal tunnel syndrome, confirmed by EMG.  The patient presents at this time for an endoscopic right carpal tunnel release.  The patient also notes a history of a painful soft tissue mass on the volo-ulnar aspect of the right wrist, consistent with a ganglion cyst.  The cyst has returned despite an aspiration under ultrasound.  She presents at this time to have the cyst excised as well.  Procedure:   The patient was brought into the operating room and lain in the supine position. After adequate IV sedation was achieved, a timeout was performed to verify the appropriate surgical site before a Bier block was placed by the anesthesiologist and the tourniquet inflated to 250 mmHg. The right hand and upper extremity were prepped with ChloraPrep solution before being draped sterilely. Preoperative antibiotics were administered. An approximately 1.5-2 cm incision was made over the volar wrist flexion crease, centered over the palmaris longus tendon. The incision was carried down through the subcutaneous tissues with care taken to identify and protect any neurovascular structures. The distal forearm fascia was  penetrated just proximal to the transverse carpal ligament. The soft tissues were released off the superficial and deep surfaces of the distal forearm fascia and this was released proximally for 3-4 cm under direct visualization.  Attention was directed distally. The Therapist, nutritional was passed beneath the transverse carpal ligament along the ulnar aspect of the carpal tunnel and used to release any adhesions as well as to remove any adherent synovial tissue before first the smaller then the larger of the two dilators were passed beneath the transverse carpal ligament along the ulnar margin of the carpal tunnel. The slotted cannula was introduced and the endoscope was placed into the slotted cannula and the undersurface of the transverse carpal ligament visualized. The distal margin of the transverse carpal ligament was marked by placing a 25-gauge needle percutaneously at Kaplan's cardinal point so that it entered the distal portion of the slotted cannula. Under endoscopic visualization, the transverse carpal ligament was released from proximal to distal using the end-cutting blade. A second pass was performed to ensure complete release of the ligament. The adequacy of release was verified both endoscopically and by palpation using the freer elevator.  Attention was then directed to the ulnar side of the wrist.  The mass was identified by palpation and an approximately 2 to 3 cm incision made along the flexor carpi ulnaris tendon centered over the mass.  The incision was carried down through the subcutaneous tissues to expose the soft tissue mass.  During its excision, a small amount of clear gelatinous  fluid was encountered, consistent with the mass being a ganglion cyst.  The mass was removed in its entirety and sent to pathology.  The wounds were irrigated thoroughly with sterile saline solution before being closed using 4-0 Prolene interrupted sutures. A total of 15 cc of 0.5% plain Sensorcaine was injected  in and around the incisions before sterile bulky dressings were applied to the wounds. The patient was placed into a volar wrist splint before being awakened and returned to the recovery room in satisfactory condition after tolerating the procedure well.

## 2017-11-04 ENCOUNTER — Encounter: Payer: Self-pay | Admitting: Surgery

## 2017-11-05 LAB — SURGICAL PATHOLOGY

## 2018-06-06 IMAGING — CR DG KNEE COMPLETE 4+V*R*
4 series · 4 of 4 positions shown · non-contrast
Comparison: 01/25/2015

CLINICAL DATA: Fall

EXAM:
RIGHT KNEE - COMPLETE 4+ VIEW

[knee ap]
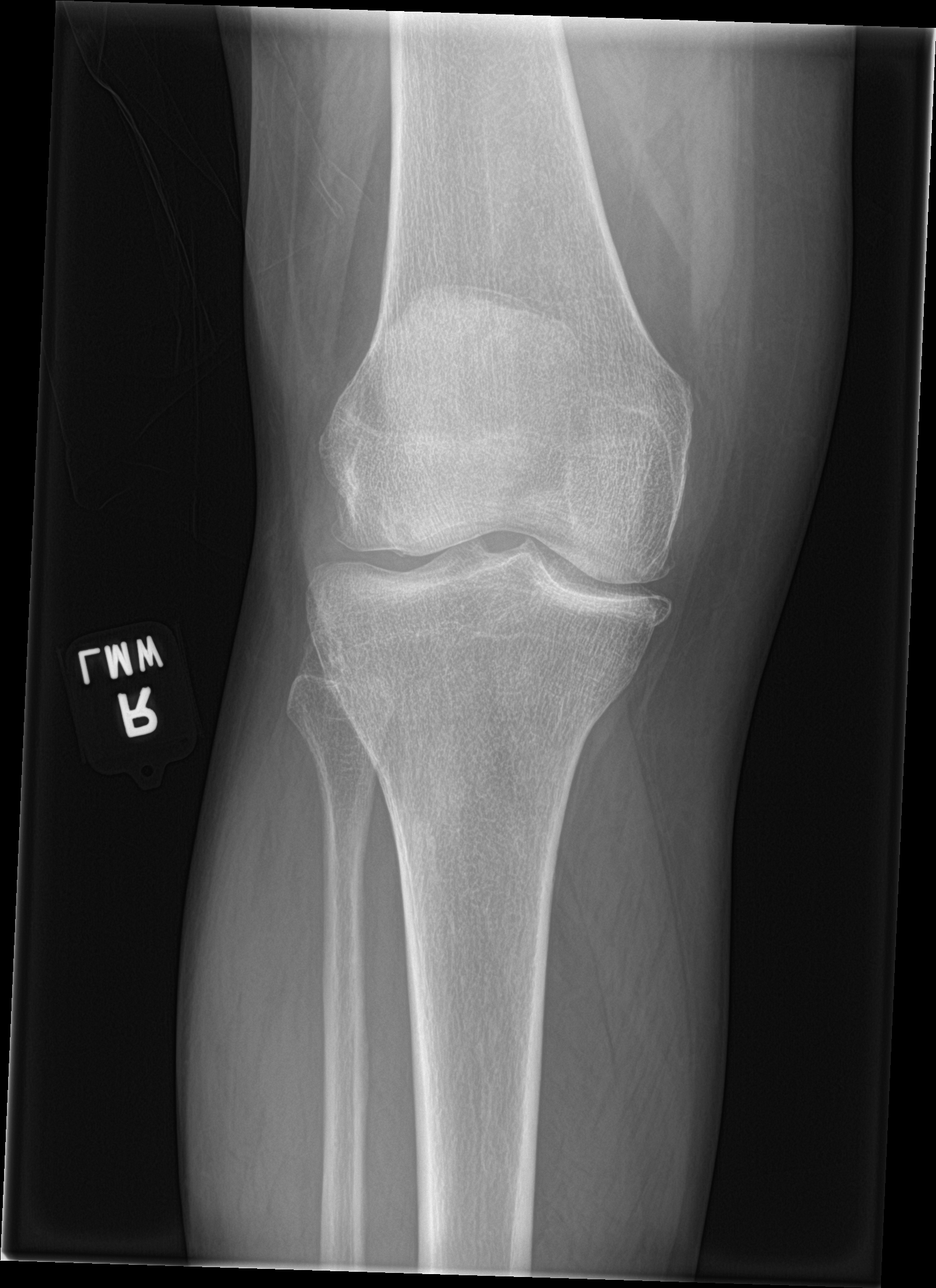

[knee obl (1 of 2)]
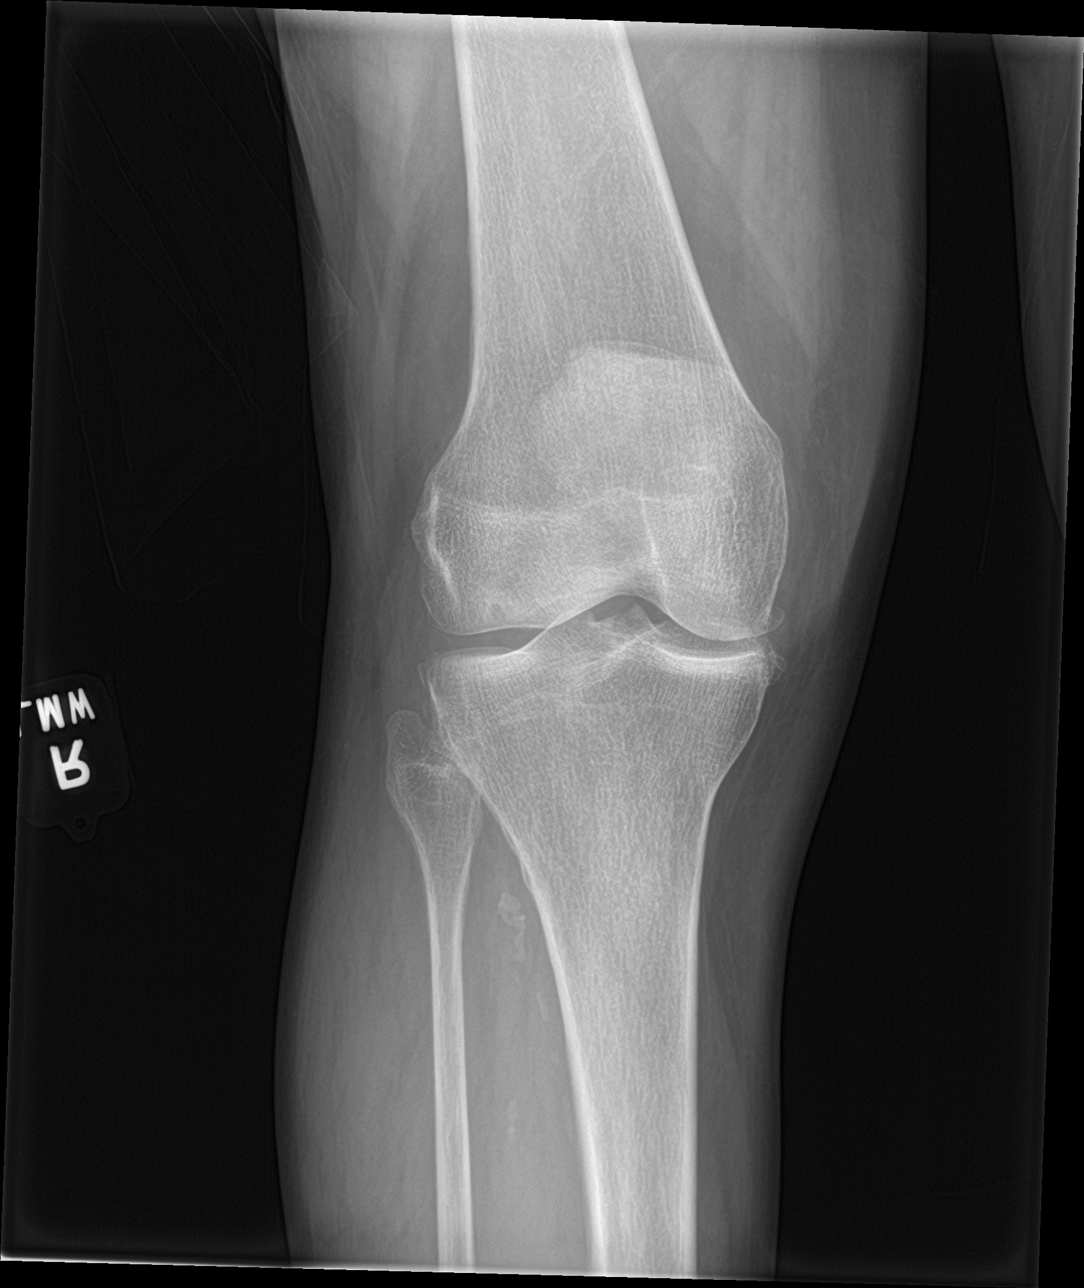

[knee obl (2 of 2)]
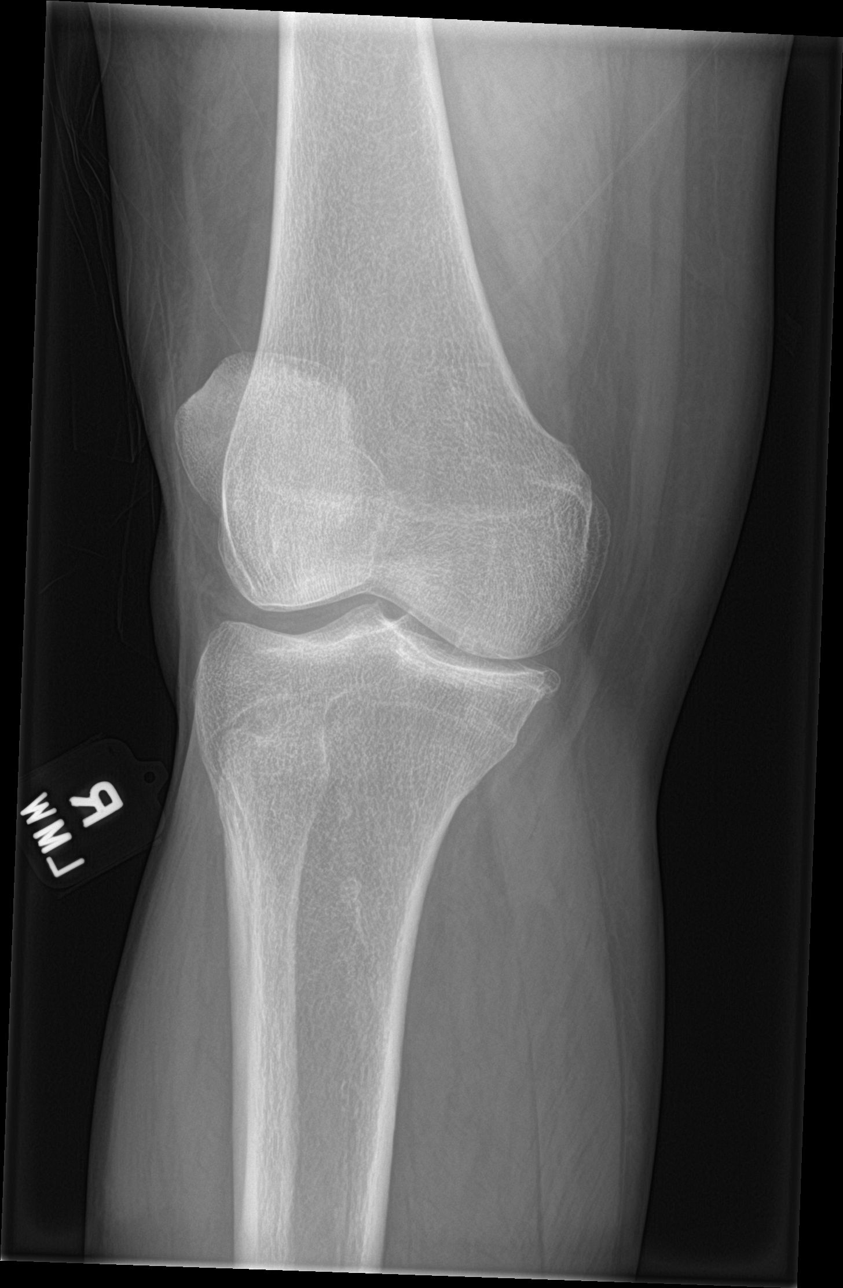

[knee lat]
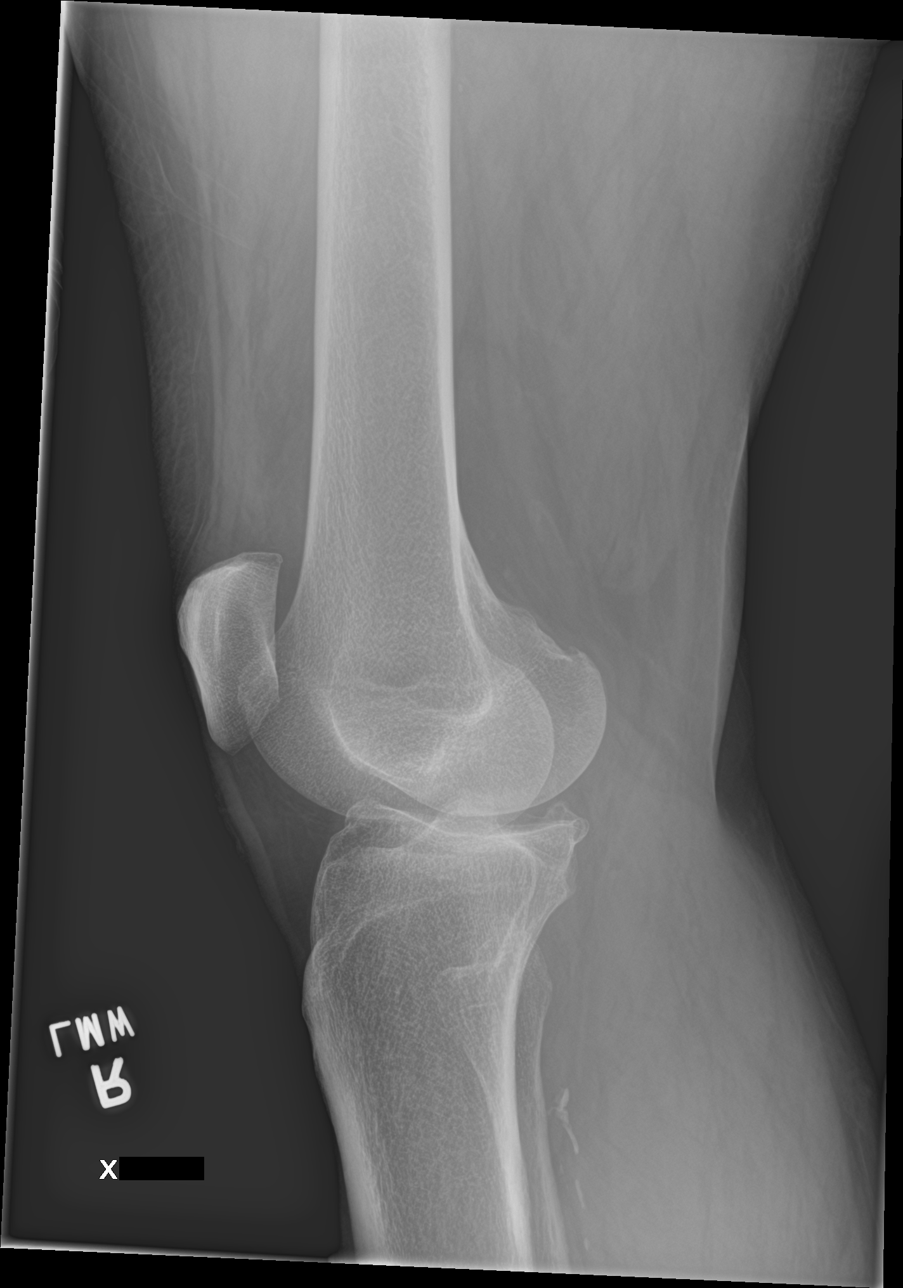

[4 of 4 positions shown; findings below may reference images not displayed]

FINDINGS: Small cortex irregularity lateral articular surface of the tibia.
Mild to moderate arthritis medial compartment. No large knee
effusion.
IMPRESSION: Cortical irregularity at the lateral articular surface of the
proximal tibia, possible acute nondisplaced fracture.

## 2019-12-14 ENCOUNTER — Encounter (AMBULATORY_SURGERY_CENTER): Payer: Self-pay | Admitting: Anesthesiology

## 2019-12-14 DIAGNOSIS — Z01818 Encounter for other preprocedural examination: Secondary | ICD-10-CM

## 2019-12-16 ENCOUNTER — Other Ambulatory Visit (INDEPENDENT_AMBULATORY_CARE_PROVIDER_SITE_OTHER): Payer: Medicare Other | Attending: Anesthesiology

## 2019-12-16 DIAGNOSIS — Z01818 Encounter for other preprocedural examination: Secondary | ICD-10-CM | POA: Insufficient documentation

## 2019-12-16 DIAGNOSIS — Z20822 Contact with and (suspected) exposure to covid-19: Secondary | ICD-10-CM | POA: Insufficient documentation

## 2019-12-16 LAB — COVID-19 CORONAVIRUS DETECTION ASSAY AT ~~LOC~~ LAB: COVID-19 Coronavirus Result: NOT DETECTED

## 2020-01-25 ENCOUNTER — Encounter (INDEPENDENT_AMBULATORY_CARE_PROVIDER_SITE_OTHER): Payer: Self-pay | Admitting: Rehabilitative and Restorative Service Providers"

## 2020-08-23 ENCOUNTER — Telehealth (INDEPENDENT_AMBULATORY_CARE_PROVIDER_SITE_OTHER): Payer: Self-pay

## 2020-08-23 ENCOUNTER — Encounter (INDEPENDENT_AMBULATORY_CARE_PROVIDER_SITE_OTHER): Payer: Self-pay | Admitting: Family Medicine

## 2020-08-23 DIAGNOSIS — Z1211 Encounter for screening for malignant neoplasm of colon: Secondary | ICD-10-CM

## 2020-08-23 NOTE — Telephone Encounter (Signed)
Received call from patient requesting to see if we had received a procedure referral. Informed the patient that I was only able to locate an authorization.  Provided the patient the [facility] fax number [phone] and patient was referred back to PCP to obtain completed referral. Patient had no further questions at this time. Thank you.

## 2020-08-23 NOTE — Telephone Encounter (Signed)
Referral received, still missing authorization, once we receive the auth we will transcribe the order. Referral has been uploaded to "Media"    Thank you

## 2020-09-06 NOTE — Telephone Encounter (Signed)
The referral is in the status "Ready to schedule" for 14 days. Please complete triage for scheduling purposes. The patient is inquiring and would like to be scheduled, thank you.

## 2020-09-06 NOTE — Telephone Encounter (Signed)
Patient called to check the status of her referral. Referral has been sent for review and the patient will be contact for scheduling. Thank you.

## 2020-09-06 NOTE — Telephone Encounter (Signed)
Received call from patient following up on status of referral. Referral transcribed on 08/23/20 and was not in any WQ and still pending asessment at time of call. Reached out to GI Manager and confirmed referral will be sent for assessment. Placed call to the patient to advise once referral is assessed she will be contacted to schedule. Patient verbalized understanding and had no further questions at this time.

## 2020-09-09 ENCOUNTER — Telehealth (INDEPENDENT_AMBULATORY_CARE_PROVIDER_SITE_OTHER): Payer: Self-pay

## 2020-09-09 DIAGNOSIS — Z1211 Encounter for screening for malignant neoplasm of colon: Secondary | ICD-10-CM

## 2020-09-09 NOTE — Telephone Encounter (Signed)
Agree with colonoscopy with moderate sedation: TH, HC, [facility]ASC.

## 2020-09-09 NOTE — Telephone Encounter (Signed)
Routing to Dr.Tsai for review and signature if agreeable (Colonoscopy w/ moderate pended)    Routine external referral from Dr.Nejat [facility] for colon cancer screening.     65 y/o female w/ HTN,arthritis DM,     Per referral-

## 2020-09-10 ENCOUNTER — Encounter (INDEPENDENT_AMBULATORY_CARE_PROVIDER_SITE_OTHER): Payer: Self-pay | Admitting: Gastroenterology

## 2020-09-10 ENCOUNTER — Telehealth (INDEPENDENT_AMBULATORY_CARE_PROVIDER_SITE_OTHER): Payer: Self-pay

## 2020-09-10 DIAGNOSIS — Z1211 Encounter for screening for malignant neoplasm of colon: Secondary | ICD-10-CM

## 2020-09-10 NOTE — Telephone Encounter (Signed)
Contacted patient and LVM. If the patient calls back, please assist with scheduling. Thank you.

## 2020-09-10 NOTE — Telephone Encounter (Signed)
Reviewed allergies listed in banner with the patient: yes  1. Do you have any additional known allergies to over the counter or prescription medications? Yes   If yes, please list additional medication(s) Sulfa, metal and nylon and explain reaction(s)Hives, Metal-break out, Nylon clothing-Breaks out  2. Are you allergic to latex? No    Patient is scheduled for a colonoscopy on 12/07/20 with Dr. Simona Huh. Please pend order for bowel prep and route to provider for review.     Need COVID orders placed no    Patient's preferred pharmacy:     [facility] [phone] - [address] [place], [place] - [address] AT corner of [address]  [address] [place] [address]  Phone: [phone] Fax: [phone]

## 2020-09-11 ENCOUNTER — Encounter (INDEPENDENT_AMBULATORY_CARE_PROVIDER_SITE_OTHER): Payer: Self-pay

## 2020-09-11 MED ORDER — ATORVASTATIN CALCIUM 40 MG OR TABS
ORAL_TABLET | ORAL | Status: AC
Start: 2020-08-28 — End: ?

## 2020-09-11 MED ORDER — MELATONIN PO: ORAL | Status: AC

## 2020-09-11 MED ORDER — PAROXETINE HCL 40 MG OR TABS
40.00 mg | ORAL_TABLET | Freq: Every day | ORAL | Status: AC
Start: 2020-08-28 — End: ?

## 2020-09-11 MED ORDER — LISINOPRIL 10 MG OR TABS
10.00 mg | ORAL_TABLET | Freq: Every day | ORAL | Status: AC
Start: 2020-08-03 — End: ?

## 2020-09-11 MED ORDER — METFORMIN HCL 500 MG OR TABS: 500.00 mg | ORAL_TABLET | Freq: Three times a day (TID) | ORAL | Status: AC

## 2020-09-11 MED ORDER — NA SULFATE-K SULFATE-MG SULF 17.5-3.13-1.6 GM/177ML PO SOLN
1.0000 | Freq: Once | ORAL | 0 refills | Status: AC
Start: 2020-09-11 — End: 2020-09-11

## 2020-09-11 MED ORDER — GABAPENTIN 300 MG OR CAPS
3.00 | ORAL_CAPSULE | Freq: Three times a day (TID) | ORAL | Status: AC
Start: 2020-08-27 — End: ?

## 2020-09-11 MED ORDER — METAMUCIL FIBER PO: ORAL | Status: AC

## 2020-09-11 MED ORDER — HYDROCHLOROTHIAZIDE 12.5 MG OR CAPS
ORAL_CAPSULE | ORAL | Status: AC
Start: 2020-08-27 — End: ?

## 2020-09-11 MED ORDER — HYDROCODONE-ACETAMINOPHEN 7.5-325 MG OR TABS
ORAL_TABLET | ORAL | Status: AC
Start: 2020-08-28 — End: ?

## 2020-09-11 MED ORDER — ROPINIROLE HCL 1 MG OR TABS: 1.00 mg | ORAL_TABLET | Freq: Three times a day (TID) | ORAL | Status: AC

## 2020-09-11 NOTE — Telephone Encounter (Signed)
Bowel prep order is pending provider approval. Pharmacy confirmed.

## 2020-09-11 NOTE — Telephone Encounter (Signed)
Allergies and medications updated in chart.

## 2020-11-08 ENCOUNTER — Other Ambulatory Visit (INDEPENDENT_AMBULATORY_CARE_PROVIDER_SITE_OTHER): Payer: Self-pay | Admitting: Gastroenterology

## 2020-11-08 LAB — EMMI , COLONOSCOPY: EMMI Video Order Number: 14425997120

## 2020-11-28 ENCOUNTER — Telehealth (INDEPENDENT_AMBULATORY_CARE_PROVIDER_SITE_OTHER): Payer: Self-pay

## 2020-11-28 NOTE — Telephone Encounter (Signed)
Pre-procedure call made. Left a message for the patient to call back to confirm their procedure check in time, procedure date, and location. Also to remind the patient that they need transportation post procedure, pick up their bowel prep (if applicable) and confirm our current COVID protocols/testing guidelines.    When the patient calls back please ask the COVID screening questions and document. Thank you.

## 2020-12-04 NOTE — Telephone Encounter (Addendum)
Patient called back to confirm upcoming procedure scheduled for 12/07/2020.     Are you currently experiencing any of the following symptoms (fever, new cough, shortness of breath or loss of taste and smell)?  no  Patient is taking metformin and has an appointment with their prescribing physician tomorrow. They will ask their provider if they need to reschedule and for instructions for holding it.  Emailed instructions letter to patient.  Confirmed with the patient the time, date, and location. Reminded patient that they need transportation post procedure and discussed preparation instructions. Patient had no further questions.Thank you

## 2020-12-07 ENCOUNTER — Encounter (HOSPITAL_BASED_OUTPATIENT_CLINIC_OR_DEPARTMENT_OTHER): Admission: RE | Disposition: A | Discharge status: Home Health | Payer: Self-pay | Attending: Gastroenterology

## 2020-12-07 ENCOUNTER — Ambulatory Visit
Admission: RE | Admit: 2020-12-07 | Discharge: 2020-12-07 | Disposition: A | Discharge status: Home / Self Care | Payer: BLUE CROSS/BLUE SHIELD | Attending: Gastroenterology | Admitting: Gastroenterology

## 2020-12-07 DIAGNOSIS — Z7984 Long term (current) use of oral hypoglycemic drugs: Secondary | ICD-10-CM | POA: Insufficient documentation

## 2020-12-07 DIAGNOSIS — Z1211 Encounter for screening for malignant neoplasm of colon: Secondary | ICD-10-CM | POA: Insufficient documentation

## 2020-12-07 DIAGNOSIS — Z882 Allergy status to sulfonamides status: Secondary | ICD-10-CM | POA: Insufficient documentation

## 2020-12-07 DIAGNOSIS — I10 Essential (primary) hypertension: Secondary | ICD-10-CM | POA: Insufficient documentation

## 2020-12-07 DIAGNOSIS — Z800 Family history of malignant neoplasm of digestive organs: Secondary | ICD-10-CM

## 2020-12-07 DIAGNOSIS — E119 Type 2 diabetes mellitus without complications: Secondary | ICD-10-CM | POA: Insufficient documentation

## 2020-12-07 DIAGNOSIS — Z7982 Long term (current) use of aspirin: Secondary | ICD-10-CM | POA: Insufficient documentation

## 2020-12-07 DIAGNOSIS — K648 Other hemorrhoids: Secondary | ICD-10-CM | POA: Insufficient documentation

## 2020-12-07 LAB — GLUCOSE (POCT)
Glucose (POCT): 196 mg/dL — ABNORMAL HIGH (ref 70–99)
Glucose (POCT): 184 mg/dL — ABNORMAL HIGH (ref 70–99)

## 2020-12-07 LAB — COVID-19 BINAXNOW ANTIGEN (POCT): COVID-19 Antigen (POCT): NEGATIVE

## 2020-12-07 SURGERY — COLONOSCOPY
Anesthesia: Moderate Sedation - by non-anesthesia staff only

## 2020-12-07 MED ORDER — FENTANYL CITRATE (PF) 100 MCG/2ML IJ SOLN
INTRAMUSCULAR | Status: AC
Start: 2020-12-07 — End: ?
  Filled 2020-12-07: qty 2

## 2020-12-07 MED ORDER — MIDAZOLAM HCL 5 MG/5ML IJ SOLN
INTRAMUSCULAR | Status: DC | PRN
Start: 2020-12-07 — End: 2020-12-07
  Administered 2020-12-07 (×3): 1 mg via INTRAVENOUS
  Administered 2020-12-07: 2 mg via INTRAVENOUS
  Administered 2020-12-07: 1 mg via INTRAVENOUS

## 2020-12-07 MED ORDER — FENTANYL CITRATE (PF) 100 MCG/2ML IJ SOLN
INTRAMUSCULAR | Status: DC | PRN
Start: 2020-12-07 — End: 2020-12-07
  Administered 2020-12-07: 25 ug via INTRAVENOUS
  Administered 2020-12-07: 50 ug via INTRAVENOUS
  Administered 2020-12-07 (×3): 25 ug via INTRAVENOUS

## 2020-12-07 MED ORDER — MIDAZOLAM HCL 5 MG/5ML IJ SOLN
INTRAMUSCULAR | Status: AC
Start: 2020-12-07 — End: ?
  Filled 2020-12-07: qty 10

## 2020-12-07 MED ORDER — SODIUM CHLORIDE 0.9 % IV SOLN
INTRAVENOUS | Status: AC | PRN
Start: 2020-12-07 — End: 2020-12-07
  Administered 2020-12-07: 500 mL via INTRAVENOUS

## 2020-12-07 NOTE — Procedures (Signed)
[facility]  Gastroenterology/Special Procedures    Patient Name: Jill Carr  Date of Birth: 08/04/1955  Record Number: [id number]  Date of Procedure: 12/07/2020  Referring Physician: Fannie Knee M.D.  Endoscopist: Sherren Mocha   Asst. Endoscopist:     Nurse: Jonnie Kind    PROCEDURE PERFORMED  Colonoscopy    INDICATIONS FOR EXAMINATION   Index screening for colorectal cancer in the context of a family history positive for colon cancer affecting her father in his 81s.     Instruments:  549 Olympus pediatric 180 narrow band imaging colonoscope  Medications: Versed 6 mg IVP, Fentanyl 125 mcg IVP  Moderate Sedation Time: 25  minutes of physician intra-service time was spent providing moderate sedation to the patient in continous face-to-face monitoring of moderate sedation with the nurse Jonnie Kind  Sedation StartTime: 10:43:00 AM  Sedation   Stop Time: 11:08:00 AM      The attending physician, Dr. Simona Huh, was present for the entire examination.  Procedure Technique: A physical exam was performed. Informed consent was obtained from the patient (or the patient's designee) after explaining all the risks (perforation, bleeding, infection, missed lesions, and adverse effects to the medicine),   benefits, and alternatives to the procedure which the patient or designee appeared to understand and so stated.  A formal timeout was performed.  The patient was connected to the monitoring devices and placed in the left lateral position. Continuous   oxygen was provided with a nasal cannula and IV medicine administered through an indwelling cannula. After adequate sedation was achieved, a digital rectal exam was performed and the colonoscope was introduced into the rectum and advanced under direct   visualization to the terminal ileum.  The quality of the colonic preparation was Good. The scope was subsequently removed slowly while carefully examining the color, texture, anatomy, and integrity of the mucosa on the  way out, with 360 degree   circumferential views. Carbon dioxide gas was used for insufflation. In the rectum, the scope was retroflexed to evaluate for anorectal pathology such as hemorrhoids. The patient was subsequently transferred to the recovery area in satisfactory   condition.  Extent of Exam: terminal ileum.  Biopsy Obtained: No.  Complications: .   Estimated Blood Loss: None.   Need for Future Anesthesia: Depends on procedure if need Anesthesiology or moderate sedation.  Cecal Withdrawal Time:  Total Procedure Time:     FINDINGS  The mucosa in the terminal ileum appeared normal, without erosion or ulcer. The mucosa throughout the colon appeared shiny, with normal vascular pattern and folds. No polyps were encountered on today's exam. Retroflexion in the rectum revealed small   internal hemorrhoids.    ENDOSCOPIC DIAGNOSIS  1. No polyps present on today's exam.   2. Small internal hemorrhoids.    RECOMMENDATIONS  1. Fiber rich diet, with goal 25-30 grams fiber intake per day.   2. Avoid prolonged sitting or straining with bowel movements.   3. Based on family history, your next colonoscopy should take place in five years.  4. Please follow-up with your referring provider.    Signature:_________________________________ Sherren Mocha, MD              [id number])    Note:  The final official report is in the [facility] medical record.      This electronic signature authenticates all electronic and/or handwritten documentation, including orders, generated by the signer during the episode of care contained in this record.  12/07/2020 11:11:38 AM By Sherren Mocha MD

## 2020-12-07 NOTE — Discharge Instructions (Signed)
Pt discharged to home,  All discharge instructions reviewed with patient and family/friend Discussed - diet, activity, and restrictions.  AVS given to Pt representative  All Questions answered regarding DC, Personal  Belongings  with Pt.

## 2020-12-07 NOTE — H&P (Signed)
HISTORY AND PHYSICAL    INDICATION FOR PROCEDURE: Index screening for colorectal cancer in the context of a family history positive for colon cancer affecting her father in his 84s.     PAST MEDICAL HISTORY:  -HTN  -DM  -arthritis  -history of pancreatitis  -mood disorder    PAST SURGICAL HISTORY:  -pelvic surgery  -C-section  -wrist surgery x2  -cholecystectomy    ALLERGIES:  Iron metal [iron] and Sulfa drugs    MEDICATIONS:  -metformin  -glipizide  -aspirin 81 mg daily  -hydrocodone/acetaminophen prn pain  -HCTZ  -hydroxyzine  -paxil  -neurontin prn pain    PHYSICAL EXAMINATION:  Vital Signs:    12/07/20  0923   BP: 119/66   Pulse: 98   Resp: 16   Temp: 96.8 F (36 C)   SpO2: 96%     General:   Alert, conversable, pleasant  Lungs:    No wheezing or stridor  CV:    No friction rub or mechanical click  Abdomen:   Soft, protuberant, no borborygmus    ASA Score:   2   Airway (Mallimpati) Score:  Class II - Soft palate, uvula, and fauces are visible.    ASSESSMENT AND PLAN:  Proceed to planned procedure.    The patient (or the patient's designee) has consented to the procedure, which may be done with sedation.  I have assessed the patient's status immediately prior to this procedure.  I have discussed pain management needs and options for the patient with the patient or the patient's designee.      The patient  (or the patient's designee) agrees that the patient will be full code for the duration of the procedure.    Sedation options, risks, and plans have been discussed with the patient or the patient's designee.  Questions were answered.  The patient or the patient's designee agrees to proceed as planned.    Rich Number

## 2020-12-13 ENCOUNTER — Encounter (HOSPITAL_BASED_OUTPATIENT_CLINIC_OR_DEPARTMENT_OTHER): Payer: Self-pay | Admitting: Family Medicine

## 2020-12-13 DIAGNOSIS — M21619 Bunion of unspecified foot: Secondary | ICD-10-CM

## 2020-12-24 ENCOUNTER — Encounter (HOSPITAL_BASED_OUTPATIENT_CLINIC_OR_DEPARTMENT_OTHER): Payer: Self-pay | Admitting: Foot & Ankle Surgery

## 2020-12-24 DIAGNOSIS — M79672 Pain in left foot: Secondary | ICD-10-CM

## 2020-12-24 DIAGNOSIS — M79671 Pain in right foot: Secondary | ICD-10-CM

## 2021-01-02 ENCOUNTER — Encounter (HOSPITAL_BASED_OUTPATIENT_CLINIC_OR_DEPARTMENT_OTHER): Payer: Self-pay | Admitting: Foot & Ankle Surgery

## 2021-01-02 ENCOUNTER — Ambulatory Visit (HOSPITAL_BASED_OUTPATIENT_CLINIC_OR_DEPARTMENT_OTHER): Payer: BLUE CROSS/BLUE SHIELD | Admitting: Foot & Ankle Surgery

## 2021-01-02 ENCOUNTER — Ambulatory Visit (HOSPITAL_BASED_OUTPATIENT_CLINIC_OR_DEPARTMENT_OTHER)
Admit: 2021-01-02 | Discharge: 2021-01-02 | Disposition: A | Discharge status: Home Health | Payer: BLUE CROSS/BLUE SHIELD

## 2021-01-02 ENCOUNTER — Ambulatory Visit
Admission: RE | Admit: 2021-01-02 | Discharge: 2021-01-02 | Disposition: A | Discharge status: Home / Self Care | Payer: BLUE CROSS/BLUE SHIELD | Attending: Foot & Ankle Surgery | Admitting: Foot & Ankle Surgery

## 2021-01-02 VITALS — BP 151/81 | HR 78 | Temp 97.1°F | Resp 17 | Ht 64.0 in | Wt 170.0 lb

## 2021-01-02 DIAGNOSIS — E119 Type 2 diabetes mellitus without complications: Secondary | ICD-10-CM

## 2021-01-02 DIAGNOSIS — M21612 Bunion of left foot: Secondary | ICD-10-CM

## 2021-01-02 DIAGNOSIS — M2011 Hallux valgus (acquired), right foot: Secondary | ICD-10-CM

## 2021-01-02 DIAGNOSIS — M2012 Hallux valgus (acquired), left foot: Secondary | ICD-10-CM

## 2021-01-02 DIAGNOSIS — B351 Tinea unguium: Secondary | ICD-10-CM

## 2021-01-02 DIAGNOSIS — M79672 Pain in left foot: Secondary | ICD-10-CM | POA: Insufficient documentation

## 2021-01-02 DIAGNOSIS — M21611 Bunion of right foot: Secondary | ICD-10-CM | POA: Insufficient documentation

## 2021-01-02 DIAGNOSIS — M2142 Flat foot [pes planus] (acquired), left foot: Secondary | ICD-10-CM

## 2021-01-02 DIAGNOSIS — M79671 Pain in right foot: Secondary | ICD-10-CM

## 2021-01-02 DIAGNOSIS — M2141 Flat foot [pes planus] (acquired), right foot: Secondary | ICD-10-CM

## 2021-01-02 DIAGNOSIS — M21619 Bunion of unspecified foot: Secondary | ICD-10-CM | POA: Insufficient documentation

## 2021-01-02 NOTE — Progress Notes (Signed)
Foot and ankle orthopedics   Podiatry consultation     Referring Physician: Lelon Perla, MD  [address]  [place],  [place] [address]    Primary Care Physician: Fannie Knee Arab    CC: Right foot pain       History of Present Illness:   Jill Carr is a 65 year old female with a hx of DM here for evaluation of right foot bunion pain   Feels right hallux moving over more     Pain affecting bunion that is made worse with walking/standing  + associated edema  Has tried inserts and various shoes  She had plan to have sx for the right foot bunion however 2/2 insurance change, she is unable to f/u with her surgeon     She would also like a DM foot exam/check  No skin concerns  States nails are thick so difficult to cut   Notes her BS's are not well controlled, BS's range in the low 200's   She is a current every day smoker with a 40 year smoking hx       Past Medical History:  Past Medical History:   Diagnosis Date    HTN (hypertension)     Type 2 diabetes mellitus, without long-term current use of insulin (CMS-HCC)        Past Surgical History:  No past surgical history on file.    Allergies   Allergen Reactions    Iron Metal [Iron] Hives     3/30- patient stated that she is allergic to ALL metal jewelry (gold, silver).     Sulfa Drugs Hives       Current Outpatient Medications   Medication Sig Dispense Refill    atorvastatin (LIPITOR) 40 MG tablet       gabapentin (NEURONTIN) 300 MG capsule Take 3 capsules by mouth 3 times daily.      hydrochlorothiazide (MICROZIDE) 12.5 MG capsule TAKE 1 CAPSULE BY MOUTH EVERY DAY IN THE MORNING      HYDROcodone-acetaminophen (NORCO) 7.5-325 MG tablet TAKE 1 TABLET BY MOUTH EVERY 6 HOURS AS NEEDED FOR 14 DAYS      lisinopril (PRINIVIL, ZESTRIL) 10 MG tablet Take 10 mg by mouth daily.      MELATONIN PO       METAMUCIL FIBER PO       metFORMIN (GLUCOPHAGE) 500 mg tablet Take 500 mg by mouth 3 times daily.      PARoxetine (PAXIL) 40 MG tablet Take 40 mg by  mouth daily.      rOPINIRole (REQUIP) 1 MG tablet Take 1 mg by mouth 3 times daily.       No current facility-administered medications for this visit.       Social History:  The patient reports that she has been smoking cigarettes. She uses smokeless tobacco.    Family History:   The patient's family history is not on file.       Review of Systems:  As noted above in the present and past history sections. In addition, the following systems are reviewed with findings as noted below.  CONSTITUTIONAL: No fevers, chills or weight loss.  HEENT: No complaints of changes in vision, hearing, speech, or swallowing.   CARDIOVASCULAR: No complaints of chest pain or shortness of breath.   PULMONARY: No current complaints of cough or hemoptysis.   GASTROINTESTINAL: No complaints of nausea, vomiting, diarrhea, or constipation.   GENITOURINARY: No complaints of dysuria or hematuria.  NEUROLOGIC: No focal weakness, dizziness, seizures, or headaches.  ENDOCRINOLOGIC: +DM  HEMATOLOGIC: No problems with easy bruising or bleeding.    Physical Examination (lower extremity):  GENERAL: The patient is seated, in no distress, awake, alert, and cooperative.  VITALS: Temperature:  [97.1 F (36.2 C)] 97.1 F (36.2 C) (07/21 1350)  Blood pressure (BP): (151)/(81) 151/81 (07/21 1350)  Heart Rate:  [78] 78 (07/21 1350)  Respirations:  [17] 17 (07/21 1350)  Pain Score: 7 (07/21 1352)  SpO2:  [98 %] 98 % (07/21 1350)  NEUROLOGIC: Sensation testing is intact to light touch  VASCULAR: Dorsalis pedis pulses are palpable, 2/4 bilateral.   Posterior tibialis pulses are palpable, 2/4 bilateral.   There is no edema bilateral feet or legs.   Both feet feel equal in temperature.  The foot is well perfused.   Hair growth on toes is normal.  Skin is dry.    Subcapillary venous plexus filling time is WNL.    SKIN: No skin lesions noted of feet or ankles.  mycotic b/l hallux nails  MUSCULOSKELETAL:   Right LE exam (Non-Wt Bearing Assessment):   Deformity:  pes planus, HAV  Right ankle DF 5 degrees               PF 40 degrees  Right STJ inversion 20 degrees        Eversion 10 degrees  Right 1st MTPJ DF 65 degrees        PF 30 degrees  Strength in the right lower extremity is intact, no focal weakness.   Pain: none    Left LE exam (Non-Wt Bearing Assessment):   Deformity: pes planus, HAV  Left ankle DF 10 degrees            PF 40 degrees  Left STJ inversion 20 degrees     Eversion 10 degrees  Left 1st MTPJ DF 65 degrees      PF 30 degrees  Strength in the left lower extremities is intact, no focal weakness. :   Pain: none      Motor Function:   Right EHL: 5/5 TA: 5/5 Per: 5/5 Gastroc: 5/5 PT: 5/5    Left EHL: 5/5 TA: 5/5 Per: 5/5 Gastroc: 5/5 PT: 5/5             Imaging:  I have personally reviewed all imaging.    B/L foot xrays, 3 WB views 01/02/21:  No acute fx  Bilateral pes planus  Bilateral hallux valgus, moderate to severe on the right and mild-to-moderate on the left. Accompanying bony and soft tissue bunions    Procedure:  After the feet were washed, 7 mycotic toenails were debrided using mycotic nail nippers. No bleeding was encountered and the patient tolerated the procedure well.      A/P:     ICD-10-CM ICD-9-CM    1. Right foot pain  M79.671 729.5 [facility]   2. Bunion of great toe of right foot  M21.611 727.1 [facility]   3. Encounter for diabetic foot exam (CMS-HCC)  E11.9 250.00    4. Onychomycosis  B35.1 110.1    5. Asymptomatic bunion of left foot  M21.612 727.1      X-rays reviewed with the patient.  All questions answered.   Findings and plan reviewed with the patient.    DM foot exam performed   Patient to check feet daily for any signs of trouble  Wear white cotton socks  Keep feet well moisturized  Avoid barefoot  walking  We discussed conservative tx options for bunions including supportive shoes with a wide toe box, orthotics  Dispensed toe separators and instructed on use  Patient expresses she would like sx evaluation; I did  discuss smoking cessation with her as well as improved glucose control to optimize her for sx   Patient to discuss smoking cessation options with PCP  I spent 30 minutes caring for this patient, including preparing to see the patient, obtaining and reviewing the history, seeing the patient and performing a medically appropriate examination, reviewing diagnostic tests and labs, ordering tests/medications or procedures, providing education, counseling and coordination of care, and documenting clinic information in the record.All questions were answered and Yelena Metzer understood and was satisfied with this plan.  F/U PRN       Ellyssa Zagal Ria Comment, DPM

## 2021-02-18 ENCOUNTER — Ambulatory Visit (HOSPITAL_BASED_OUTPATIENT_CLINIC_OR_DEPARTMENT_OTHER): Payer: BLUE CROSS/BLUE SHIELD | Admitting: Orthopaedic Surgery

## 2021-03-04 ENCOUNTER — Ambulatory Visit (HOSPITAL_BASED_OUTPATIENT_CLINIC_OR_DEPARTMENT_OTHER): Payer: BLUE CROSS/BLUE SHIELD | Admitting: Orthopaedic Surgery

## 2021-03-07 ENCOUNTER — Encounter (HOSPITAL_BASED_OUTPATIENT_CLINIC_OR_DEPARTMENT_OTHER): Payer: Self-pay | Admitting: Orthopaedic Surgery

## 2021-04-01 ENCOUNTER — Ambulatory Visit (HOSPITAL_BASED_OUTPATIENT_CLINIC_OR_DEPARTMENT_OTHER): Payer: BLUE CROSS/BLUE SHIELD | Admitting: Orthopaedic Surgery

## 2021-04-15 ENCOUNTER — Ambulatory Visit (HOSPITAL_BASED_OUTPATIENT_CLINIC_OR_DEPARTMENT_OTHER): Payer: BLUE CROSS/BLUE SHIELD | Admitting: Orthopaedic Surgery

## 2022-07-18 ENCOUNTER — Emergency Department: Payer: Medicare Other

## 2022-07-18 ENCOUNTER — Emergency Department
Admission: EM | Admit: 2022-07-18 | Discharge: 2022-07-18 | Disposition: A | Discharge status: Home / Self Care | Payer: Medicare Other | Attending: Emergency Medicine | Admitting: Emergency Medicine

## 2022-07-18 DIAGNOSIS — M79672 Pain in left foot: Secondary | ICD-10-CM | POA: Insufficient documentation
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
## 2022-07-18 NOTE — ED Notes (Signed)
Pt instructed on crutches. Pt return demonstrates proper use.

## 2022-07-18 NOTE — Discharge Instructions (Signed)
Please seek medical attention for any high fevers, chest pain, shortness of breath, change in behavior, persistent vomiting, bloody stool or any other new or concerning symptoms.
